# Patient Record
Sex: Female | Born: 1955 | Race: White | Hispanic: No | Marital: Married | Smoking: Never smoker
Health system: Southern US, Community
[De-identification: ages and names within clinical notes are randomized; demographics above are authoritative.]

## PROBLEM LIST (undated history)

## (undated) DIAGNOSIS — B019 Varicella without complication: Secondary | ICD-10-CM

## (undated) DIAGNOSIS — K219 Gastro-esophageal reflux disease without esophagitis: Secondary | ICD-10-CM

## (undated) DIAGNOSIS — F329 Major depressive disorder, single episode, unspecified: Secondary | ICD-10-CM

## (undated) DIAGNOSIS — J189 Pneumonia, unspecified organism: Secondary | ICD-10-CM

## (undated) DIAGNOSIS — Z9889 Other specified postprocedural states: Secondary | ICD-10-CM

## (undated) DIAGNOSIS — N390 Urinary tract infection, site not specified: Secondary | ICD-10-CM

## (undated) DIAGNOSIS — R7303 Prediabetes: Secondary | ICD-10-CM

## (undated) DIAGNOSIS — J45909 Unspecified asthma, uncomplicated: Secondary | ICD-10-CM

## (undated) DIAGNOSIS — E079 Disorder of thyroid, unspecified: Secondary | ICD-10-CM

## (undated) DIAGNOSIS — J309 Allergic rhinitis, unspecified: Secondary | ICD-10-CM

## (undated) DIAGNOSIS — F419 Anxiety disorder, unspecified: Secondary | ICD-10-CM

## (undated) DIAGNOSIS — E039 Hypothyroidism, unspecified: Secondary | ICD-10-CM

## (undated) DIAGNOSIS — N393 Stress incontinence (female) (male): Secondary | ICD-10-CM

## (undated) DIAGNOSIS — M1990 Unspecified osteoarthritis, unspecified site: Secondary | ICD-10-CM

## (undated) DIAGNOSIS — F32A Depression, unspecified: Secondary | ICD-10-CM

## (undated) HISTORY — PX: TONSILLECTOMY: SUR1361

## (undated) HISTORY — PX: CHOLECYSTECTOMY: SHX55

## (undated) HISTORY — DX: Gastro-esophageal reflux disease without esophagitis: K21.9

## (undated) HISTORY — PX: REFRACTIVE SURGERY: SHX103

## (undated) HISTORY — DX: Stress incontinence (female) (male): N39.3

## (undated) HISTORY — DX: Varicella without complication: B01.9

## (undated) HISTORY — DX: Major depressive disorder, single episode, unspecified: F32.9

## (undated) HISTORY — DX: Allergic rhinitis, unspecified: J30.9

## (undated) HISTORY — PX: KNEE ARTHROSCOPY: SHX127

## (undated) HISTORY — DX: Depression, unspecified: F32.A

## (undated) HISTORY — DX: Urinary tract infection, site not specified: N39.0

## (undated) HISTORY — DX: Disorder of thyroid, unspecified: E07.9

---

## 2004-05-13 ENCOUNTER — Ambulatory Visit: Payer: Self-pay

## 2005-06-18 ENCOUNTER — Ambulatory Visit: Payer: Self-pay

## 2006-06-22 ENCOUNTER — Ambulatory Visit: Payer: Self-pay

## 2007-07-14 ENCOUNTER — Ambulatory Visit: Payer: Self-pay

## 2008-07-20 ENCOUNTER — Ambulatory Visit: Payer: Self-pay

## 2009-07-26 ENCOUNTER — Ambulatory Visit: Payer: Self-pay

## 2009-10-09 ENCOUNTER — Ambulatory Visit: Payer: Self-pay

## 2009-10-10 ENCOUNTER — Ambulatory Visit: Payer: Self-pay

## 2010-02-19 ENCOUNTER — Ambulatory Visit: Payer: Self-pay | Admitting: Specialist

## 2010-08-08 ENCOUNTER — Ambulatory Visit: Payer: Self-pay

## 2011-12-08 ENCOUNTER — Other Ambulatory Visit: Payer: Self-pay

## 2011-12-14 ENCOUNTER — Encounter: Payer: Self-pay | Admitting: Family Medicine

## 2013-06-24 ENCOUNTER — Ambulatory Visit: Payer: Self-pay | Admitting: Family Medicine

## 2013-06-29 ENCOUNTER — Ambulatory Visit: Payer: Self-pay | Admitting: Family Medicine

## 2013-08-07 ENCOUNTER — Ambulatory Visit: Payer: Self-pay | Admitting: Family Medicine

## 2014-10-17 ENCOUNTER — Other Ambulatory Visit: Payer: Self-pay | Admitting: Family Medicine

## 2014-10-17 DIAGNOSIS — K219 Gastro-esophageal reflux disease without esophagitis: Secondary | ICD-10-CM

## 2014-10-17 NOTE — Telephone Encounter (Signed)
This is a pt of Dr. Santiago Bur  Last ov was on 03/15/2014  thanks

## 2014-11-08 ENCOUNTER — Other Ambulatory Visit: Payer: Self-pay | Admitting: Unknown Physician Specialty

## 2014-11-08 DIAGNOSIS — N6459 Other signs and symptoms in breast: Secondary | ICD-10-CM

## 2014-11-16 ENCOUNTER — Ambulatory Visit
Admission: RE | Admit: 2014-11-16 | Discharge: 2014-11-16 | Disposition: A | Discharge status: Home / Self Care | Payer: BC Managed Care – PPO | Source: Ambulatory Visit | Attending: Unknown Physician Specialty | Admitting: Unknown Physician Specialty

## 2014-11-16 DIAGNOSIS — N6459 Other signs and symptoms in breast: Secondary | ICD-10-CM

## 2014-11-16 DIAGNOSIS — R921 Mammographic calcification found on diagnostic imaging of breast: Secondary | ICD-10-CM | POA: Diagnosis not present

## 2014-11-22 ENCOUNTER — Other Ambulatory Visit: Payer: Self-pay | Admitting: Unknown Physician Specialty

## 2014-11-22 DIAGNOSIS — R921 Mammographic calcification found on diagnostic imaging of breast: Secondary | ICD-10-CM

## 2014-11-28 ENCOUNTER — Ambulatory Visit
Admission: RE | Admit: 2014-11-28 | Discharge: 2014-11-28 | Disposition: A | Discharge status: Home / Self Care | Payer: BC Managed Care – PPO | Source: Ambulatory Visit | Attending: Unknown Physician Specialty | Admitting: Unknown Physician Specialty

## 2014-11-28 DIAGNOSIS — R921 Mammographic calcification found on diagnostic imaging of breast: Secondary | ICD-10-CM | POA: Diagnosis present

## 2014-11-28 HISTORY — PX: BREAST BIOPSY: SHX20

## 2014-11-29 LAB — SURGICAL PATHOLOGY

## 2015-02-12 HISTORY — PX: HAND SURGERY: SHX662

## 2015-03-18 ENCOUNTER — Encounter: Payer: Self-pay | Admitting: Family Medicine

## 2015-03-18 ENCOUNTER — Ambulatory Visit (INDEPENDENT_AMBULATORY_CARE_PROVIDER_SITE_OTHER): Payer: BC Managed Care – PPO | Admitting: Family Medicine

## 2015-03-18 VITALS — BP 122/78 | HR 68 | Temp 98.8°F | Resp 16 | Ht 65.5 in | Wt 238.0 lb

## 2015-03-18 DIAGNOSIS — K219 Gastro-esophageal reflux disease without esophagitis: Secondary | ICD-10-CM

## 2015-03-18 DIAGNOSIS — J45909 Unspecified asthma, uncomplicated: Secondary | ICD-10-CM | POA: Insufficient documentation

## 2015-03-18 DIAGNOSIS — F419 Anxiety disorder, unspecified: Secondary | ICD-10-CM | POA: Insufficient documentation

## 2015-03-18 DIAGNOSIS — J069 Acute upper respiratory infection, unspecified: Secondary | ICD-10-CM | POA: Diagnosis not present

## 2015-03-18 DIAGNOSIS — K2100 Gastro-esophageal reflux disease with esophagitis, without bleeding: Secondary | ICD-10-CM | POA: Insufficient documentation

## 2015-03-18 DIAGNOSIS — F329 Major depressive disorder, single episode, unspecified: Secondary | ICD-10-CM | POA: Insufficient documentation

## 2015-03-18 MED ORDER — OMEPRAZOLE 20 MG PO CPDR: 20.0000 mg | DELAYED_RELEASE_CAPSULE | Freq: Two times a day (BID) | ORAL | Status: DC

## 2015-03-18 MED ORDER — GUAIFENESIN-CODEINE 100-10 MG/5ML PO SOLN: 5.0000 mL | ORAL | Status: DC | PRN

## 2015-03-18 NOTE — Progress Notes (Signed)
Patient ID: Diane Walsh, female   DOB: 02/06/1956, 60 y.o.   MRN: [id number]        Patient: Diane Walsh Female    DOB: 09-04-1955   60 y.o.   MRN: [id number] Visit Date: 03/18/2015  Today's Provider: Lorie Phenix, MD   Chief Complaint  Patient presents with  . URI   Subjective:    URI  This is a new problem. The current episode started in the past 7 days. The problem has been gradually improving. The fever has been present for 1 to 2 days (Low grade fevers; 99.8.  Pt says that has resolved. ). Associated symptoms include congestion, coughing, headaches, neck pain, rhinorrhea, sinus pain, sneezing, a sore throat and swollen glands. Pertinent negatives include no abdominal pain, diarrhea, ear pain, plugged ear sensation or wheezing. She has tried decongestant for the symptoms. The treatment provided mild relief.   Also need Pantoprazole refilled.   May need a prior authorization. Symptoms stable. Has not tried to taper recently. Does not think Omeprazole worked in the past.     No Known Allergies Previous Medications   FLUTICASONE (FLONASE) 50 MCG/ACT NASAL SPRAY    Place into the nose.   LEVOTHYROXINE (SYNTHROID, LEVOTHROID) 75 MCG TABLET    Take by mouth.   METHOCARBAMOL (ROBAXIN) 500 MG TABLET    TAKE 1 TABLET BY MOUTH EVERY 6-8 HOURS AS NEEDED FOR SPASMS   MONTELUKAST (SINGULAIR) 10 MG TABLET    Take 10 mg by mouth daily.   NEXIUM 40 MG CAPSULE    TAKE ONE CAPSULE BY MOUTH EVERY DAY   OXYCODONE (OXY IR/ROXICODONE) 5 MG IMMEDIATE RELEASE TABLET    Take by mouth. for pain   SERTRALINE (ZOLOFT) 100 MG TABLET    Take by mouth.   SERTRALINE (ZOLOFT) 100 MG TABLET    Take 100 mg by mouth every evening.   XERESE 5-1 % CREA    APPLY TO AFFECTED AREA 5 TIMES A DAY    Review of Systems  Constitutional: Positive for activity change and fatigue. Negative for fever, chills, diaphoresis, appetite change and unexpected weight change.  HENT: Positive for congestion, postnasal drip,  rhinorrhea, sinus pressure, sneezing, sore throat, tinnitus, trouble swallowing and voice change. Negative for ear discharge, ear pain and nosebleeds.   Eyes: Positive for discharge, redness and itching. Negative for photophobia, pain and visual disturbance.  Respiratory: Positive for cough and shortness of breath. Negative for apnea, choking, chest tightness, wheezing and stridor.   Cardiovascular: Negative.   Gastrointestinal: Negative.  Negative for abdominal pain and diarrhea.  Musculoskeletal: Positive for neck pain.  Neurological: Positive for light-headedness and headaches. Negative for dizziness.    Social History  Substance Use Topics  . Smoking status: Never Smoker   . Smokeless tobacco: Never Used  . Alcohol Use: Yes     Comment: Very Rarely   Objective:   BP 122/78 mmHg  Pulse 68  Temp(Src) 98.8 F (37.1 C) (Oral)  Resp 16  Ht 5' 5.5" (1.664 m)  Wt 238 lb (107.956 kg)  BMI 38.99 kg/m2  Physical Exam  Constitutional: She is oriented to person, place, and time. She appears well-developed and well-nourished.  HENT:  Head: Normocephalic and atraumatic.  Right Ear: External ear normal.  Left Ear: External ear normal.  Nose: Nose normal.  Mouth/Throat: Oropharynx is clear and moist.  Cardiovascular: Normal rate, regular rhythm and normal heart sounds.   Pulmonary/Chest: Effort normal and breath sounds normal.  Neurological: She is  alert and oriented to person, place, and time.      Assessment & Plan:     1. Upper respiratory infection New problem. Slowly improving. Does have history of pneumonia. Patient instructed to call back if condition worsens or does not improve.    - guaiFENesin-codeine 100-10 MG/5ML syrup; Take 5 mLs by mouth every 4 (four) hours as needed for cough.  Dispense: 120 mL; Refill: 0  2. Gastroesophageal reflux disease without esophagitis Will retry Omeprazole.  - omeprazole (PRILOSEC) 20 MG capsule; Take 1 capsule (20 mg total) by mouth 2  (two) times daily before a meal.  Dispense: 180 capsule; Refill: 1     Patient was seen and examined by Leo GrosserNancy J. Courvoisier Hamblen, MD, and note scribed by Kavin LeechLaura Walsh, CMA.  I have reviewed the document for accuracy and completeness and I agree with above. - Leo GrosserNancy J. Heran Campau, MD  Lorie PhenixNancy Martin Smeal, MD  [facility]

## 2015-05-27 ENCOUNTER — Other Ambulatory Visit: Payer: Self-pay | Admitting: Family Medicine

## 2015-05-27 DIAGNOSIS — J309 Allergic rhinitis, unspecified: Secondary | ICD-10-CM

## 2015-05-27 DIAGNOSIS — F419 Anxiety disorder, unspecified: Secondary | ICD-10-CM

## 2015-05-27 DIAGNOSIS — E039 Hypothyroidism, unspecified: Secondary | ICD-10-CM

## 2015-06-03 ENCOUNTER — Other Ambulatory Visit: Payer: Self-pay | Admitting: Family Medicine

## 2015-06-03 DIAGNOSIS — J4520 Mild intermittent asthma, uncomplicated: Secondary | ICD-10-CM

## 2015-06-10 ENCOUNTER — Other Ambulatory Visit: Payer: Self-pay | Admitting: Family Medicine

## 2015-06-10 DIAGNOSIS — F419 Anxiety disorder, unspecified: Secondary | ICD-10-CM

## 2015-06-11 NOTE — Telephone Encounter (Signed)
LOV for chronic problems/ CPE was 10/06/2013. Allene DillonEmily Drozdowski, CMA

## 2015-07-13 ENCOUNTER — Encounter: Payer: Self-pay | Admitting: Family Medicine

## 2015-07-13 ENCOUNTER — Ambulatory Visit (INDEPENDENT_AMBULATORY_CARE_PROVIDER_SITE_OTHER): Payer: BC Managed Care – PPO | Admitting: Family Medicine

## 2015-07-13 VITALS — BP 130/80 | HR 72 | Temp 98.6°F | Resp 16 | Ht 65.5 in | Wt 237.0 lb

## 2015-07-13 DIAGNOSIS — J0100 Acute maxillary sinusitis, unspecified: Secondary | ICD-10-CM

## 2015-07-13 DIAGNOSIS — H1030 Unspecified acute conjunctivitis, unspecified eye: Secondary | ICD-10-CM | POA: Diagnosis not present

## 2015-07-13 DIAGNOSIS — J019 Acute sinusitis, unspecified: Secondary | ICD-10-CM | POA: Insufficient documentation

## 2015-07-13 MED ORDER — AMOXICILLIN-POT CLAVULANATE 875-125 MG PO TABS: 1.0000 | ORAL_TABLET | Freq: Two times a day (BID) | ORAL | Status: DC

## 2015-07-13 MED ORDER — SULFACETAMIDE SODIUM 10 % OP SOLN: 2.0000 [drp] | OPHTHALMIC | Status: DC

## 2015-07-13 NOTE — Progress Notes (Signed)
Patient: Diane Walsh Female    DOB: 08-Mar-1955   60 y.o.   MRN: [id number] Visit Date: 07/13/2015  Today's Provider: Lorie PhenixNancy Braiden Rodman, MD   Chief Complaint  Patient presents with  . Conjunctivitis   Subjective:    Conjunctivitis  The current episode started 2 days ago. The problem occurs continuously. The problem has been unchanged. The problem is moderate. The symptoms are relieved by one or more OTC medications (Visine. ). Nothing aggravates the symptoms. Associated symptoms include decreased vision, eye itching, photophobia, congestion, headaches, rhinorrhea, sore throat, eye discharge and eye redness. Pertinent negatives include no fever, no double vision, no abdominal pain, no diarrhea, no nausea, no vomiting, no ear discharge, no ear pain, no hearing loss, no mouth sores, no stridor, no swollen glands, no rash and no eye pain.   Has had something like this in the past.  Did see an optometrist in the past.   Does have history of pink eye.    Eyes became irritated Thursday  07/11/2015. Eyes started out red and itchy. Now left eye is worse with redness, itching, white drainage and some blurry vision. Has been having headaches, sore throat and runny nose. No fever. No fever. Does have headache more on the right side.    Left eye more irritated. Matted shut in am.      No Known Allergies Previous Medications   FLUTICASONE (FLONASE) 50 MCG/ACT NASAL SPRAY    Place into the nose.   LEVOTHYROXINE (SYNTHROID, LEVOTHROID) 75 MCG TABLET    TAKE 1 TABLET BY MOUTH EVERY DAY   MONTELUKAST (SINGULAIR) 10 MG TABLET    TAKE 1 TABLET BY MOUTH EVERY DAY   NEXIUM 40 MG CAPSULE    TAKE ONE CAPSULE BY MOUTH EVERY DAY   SERTRALINE (ZOLOFT) 100 MG TABLET    TAKE 1 TABLET BY MOUTH EVERY EVENING   XERESE 5-1 % CREA    APPLY TO AFFECTED AREA 5 TIMES A DAY    Review of Systems  Constitutional: Positive for appetite change. Negative for fever, chills and fatigue.  HENT: Positive for congestion,  rhinorrhea and sore throat. Negative for ear discharge, ear pain, hearing loss and mouth sores.   Eyes: Positive for photophobia, discharge, redness, itching and visual disturbance. Negative for double vision and pain.  Respiratory: Negative for chest tightness, shortness of breath and stridor.   Cardiovascular: Negative for chest pain and palpitations.  Gastrointestinal: Negative for nausea, vomiting, abdominal pain and diarrhea.  Skin: Negative for rash.  Neurological: Positive for headaches. Negative for dizziness and weakness.    Social History  Substance Use Topics  . Smoking status: Never Smoker   . Smokeless tobacco: Never Used  . Alcohol Use: Yes     Comment: Very Rarely   Objective:   BP 130/80 mmHg  Pulse 72  Temp(Src) 98.6 F (37 C) (Oral)  Resp 16  Ht 5' 5.5" (1.664 m)  Wt 237 lb (107.502 kg)  BMI 38.82 kg/m2  SpO2 95%  Physical Exam  Constitutional: She is oriented to person, place, and time. She appears well-developed and well-nourished.  HENT:  Head: Normocephalic and atraumatic.  Right Ear: Tympanic membrane and external ear normal.  Left Ear: Tympanic membrane and external ear normal.  Nose: Mucosal edema and rhinorrhea present. Right sinus exhibits maxillary sinus tenderness. Left sinus exhibits maxillary sinus tenderness.  Mouth/Throat: Uvula is midline and oropharynx is clear and moist.  Eyes: EOM and lids are normal. Pupils are  equal, round, and reactive to light. Right conjunctiva is injected. Left conjunctiva is injected. Right eye exhibits normal extraocular motion. Left eye exhibits normal extraocular motion.  Neck: Normal range of motion. Neck supple.  Cardiovascular: Normal rate and regular rhythm.   Pulmonary/Chest: Effort normal and breath sounds normal. She has no wheezes. She has no rales.  Neurological: She is alert and oriented to person, place, and time.      Assessment & Plan:     1. Acute maxillary sinusitis, recurrence not  specified New problem. Worsening.  Start medication.  - amoxicillin-clavulanate (AUGMENTIN) 875-125 MG tablet; Take 1 tablet by mouth 2 (two) times daily.  Dispense: 20 tablet; Refill: 0  2. Conjunctivitis, acute New problem. Condition is worsening. Will start medication for better control.  Call if worsens or does not improve, especially eye pain or vision changes.    - sulfacetamide (BLEPH-10) 10 % ophthalmic solution; Place 2 drops into both eyes every 3 (three) hours while awake.  Dispense: 15 mL; Refill: 0  Follow up for CPE.      Lorie Phenix, MD  [facility]

## 2015-08-27 ENCOUNTER — Ambulatory Visit (INDEPENDENT_AMBULATORY_CARE_PROVIDER_SITE_OTHER): Payer: BC Managed Care – PPO | Admitting: Family Medicine

## 2015-08-27 ENCOUNTER — Encounter: Payer: Self-pay | Admitting: Family Medicine

## 2015-08-27 VITALS — BP 124/82 | HR 80 | Temp 97.9°F | Resp 16 | Ht 65.5 in | Wt 240.0 lb

## 2015-08-27 DIAGNOSIS — Z0000 Encounter for general adult medical examination without abnormal findings: Secondary | ICD-10-CM | POA: Diagnosis not present

## 2015-08-27 DIAGNOSIS — E559 Vitamin D deficiency, unspecified: Secondary | ICD-10-CM | POA: Diagnosis not present

## 2015-08-27 DIAGNOSIS — F419 Anxiety disorder, unspecified: Secondary | ICD-10-CM

## 2015-08-27 DIAGNOSIS — E039 Hypothyroidism, unspecified: Secondary | ICD-10-CM | POA: Diagnosis not present

## 2015-08-27 DIAGNOSIS — J309 Allergic rhinitis, unspecified: Secondary | ICD-10-CM

## 2015-08-27 LAB — POCT URINALYSIS DIPSTICK
Bilirubin, UA: NEGATIVE
Blood, UA: NEGATIVE
Glucose, UA: NEGATIVE
Ketones, UA: NEGATIVE
Leukocytes, UA: NEGATIVE
Nitrite, UA: NEGATIVE
Protein, UA: NEGATIVE
Spec Grav, UA: 1.01
Urobilinogen, UA: 0.2
pH, UA: 6

## 2015-08-27 NOTE — Progress Notes (Signed)
Patient: Diane MullKathy M Griesinger, Female    DOB: February 17, 1956, 60 y.o.   MRN: [id number] Visit Date: 08/27/2015  Today's Provider: Lorie PhenixNancy Pearline Yerby, MD   Chief Complaint  Patient presents with  . Annual Exam   Subjective:    Annual physical exam Diane Walsh is a 60 y.o. female who presents today for health maintenance and complete physical. She feels well. She reports not exercising. She reports she is sleeping fairly well.  Pt reports having a hard time falling asleep.   -----------------------------------------------------------------   Review of Systems  Constitutional: Negative.   HENT: Negative.   Eyes: Negative.   Respiratory: Negative.   Cardiovascular: Negative.   Gastrointestinal: Negative.   Endocrine: Negative.   Genitourinary: Negative.   Musculoskeletal: Negative.   Skin: Negative.   Allergic/Immunologic: Negative.   Neurological: Negative.   Hematological: Negative.   Psychiatric/Behavioral: Negative.     Social History      She  reports that she has never smoked. She has never used smokeless tobacco. She reports that she drinks alcohol. She reports that she does not use illicit drugs.       Social History   Social History  . Marital Status: Married    Spouse Name: N/A  . Number of Children: 2  . Years of Education: College   Occupational History  . Full Time    Social History Main Topics  . Smoking status: Never Smoker   . Smokeless tobacco: Never Used  . Alcohol Use: Yes     Comment: Very Rarely  . Drug Use: No  . Sexual Activity: Not Asked   Other Topics Concern  . None   Social History Narrative    No past medical history on file.   Patient Active Problem List   Diagnosis Date Noted  . Annual physical exam 08/27/2015  . Sinusitis, acute 07/13/2015  . Conjunctivitis, acute 07/13/2015  . Anxiety 03/18/2015  . RAD (reactive airway disease) 03/18/2015  . Esophagitis, reflux 03/18/2015  . Avitaminosis D 07/13/2008  . Allergic  rhinitis 08/23/2005  . Acid reflux 08/23/2005  . Adult hypothyroidism 08/23/2005  . Acne erythematosa 08/23/2005    Past Surgical History  Procedure Laterality Date  . Hand surgery Right 02/12/2015    [facility]    Family History        Family Status  Relation Status Death Age  . Mother Alive   . Father Deceased [id number]  . Sister Alive         Her family history includes Breast cancer in her cousin and cousin; Dementia in her mother; Diabetes in her father; Hypertension in her father and sister; Hypothyroidism in her mother.    No Known Allergies  Current Meds  Medication Sig  . fluticasone (FLONASE) 50 MCG/ACT nasal spray Place into the nose.  . levothyroxine (SYNTHROID, LEVOTHROID) 75 MCG tablet TAKE 1 TABLET BY MOUTH EVERY DAY  . montelukast (SINGULAIR) 10 MG tablet TAKE 1 TABLET BY MOUTH EVERY DAY  . omeprazole (PRILOSEC) 20 MG capsule   . sertraline (ZOLOFT) 100 MG tablet TAKE 1 TABLET BY MOUTH EVERY EVENING  . [DISCONTINUED] XERESE 5-1 % CREA APPLY TO AFFECTED AREA 5 TIMES A DAY    Patient Care Team: Lorie PhenixNancy Steffany Schoenfelder, MD as PCP - General (Family Medicine)     Objective:   Vitals: BP 124/82 mmHg  Pulse 80  Temp(Src) 97.9 F (36.6 C) (Oral)  Resp 16  Ht 5' 5.5" (1.664 m)  Wt 240 lb (108.863 kg)  BMI 39.32 kg/m2   Physical Exam  Constitutional: She is oriented to person, place, and time. She appears well-developed and well-nourished.  HENT:  Head: Normocephalic and atraumatic.  Right Ear: External ear normal.  Left Ear: External ear normal.  Nose: Nose normal.  Mouth/Throat: Oropharynx is clear and moist.  Eyes: Conjunctivae and EOM are normal. Pupils are equal, round, and reactive to light.  Neck: Normal range of motion. Neck supple.  Cardiovascular: Normal rate and normal heart sounds.   Pulmonary/Chest: Effort normal and breath sounds normal.  Neurological: She is alert and oriented to person, place, and time.  Skin: Skin is warm and  dry.  Psychiatric: She has a normal mood and affect. Her behavior is normal. Judgment and thought content normal.     Depression Screen No flowsheet data found.    Assessment & Plan:     Routine Health Maintenance and Physical Exam  Exercise Activities and Dietary recommendations Goals    None       There is no immunization history on file for this patient.  Health Maintenance  Topic Date Due  . TETANUS/TDAP  02/19/1973  . PAP SMEAR  02/20/1975  . ZOSTAVAX  02/19/2014  . INFLUENZA VACCINE  10/01/2015  . MAMMOGRAM  11/27/2016  . COLONOSCOPY  10/26/2019  . Hepatitis C Screening  Completed  . HIV Screening  Completed      Discussed health benefits of physical activity, and encouraged her to engage in regular exercise appropriate for her age and condition.      -------------------------------------------------------------------- 2. Hypothyroidism, unspecified hypothyroidism type Will check labs.   - TSH  3. Avitaminosis D Stable; will check labs.  - CBC with Differential/Platelet - Comprehensive metabolic panel - VITAMIN D 25 Hydroxy (Vit-D Deficiency, Fractures)  4. Anxiety Stable; discussed trying OTC Melatonin 1mg  to help her go to sleep at night.   Patient was seen and examined by Leo GrosserNancy J. Sufian Ravi, MD, and note scribed by Kavin LeechLaura Walsh, CMA.   I have reviewed the document for accuracy and completeness and I agree with above. - Leo GrosserNancy J. Shanyn Preisler, MD   Lorie PhenixNancy Brinkley Peet, MD  [facility]

## 2015-08-29 LAB — CBC WITH DIFFERENTIAL/PLATELET
BASOS ABS: 0.1 10*3/uL (ref 0.0–0.2)
Basos: 1 %
EOS (ABSOLUTE): 0.3 10*3/uL (ref 0.0–0.4)
Eos: 4 %
HEMOGLOBIN: 11.8 g/dL (ref 11.1–15.9)
Hematocrit: 37.1 % (ref 34.0–46.6)
IMMATURE GRANS (ABS): 0 10*3/uL (ref 0.0–0.1)
Immature Granulocytes: 0 %
LYMPHS: 24 %
Lymphocytes Absolute: 1.9 10*3/uL (ref 0.7–3.1)
MCH: 28 pg (ref 26.6–33.0)
MCHC: 31.8 g/dL (ref 31.5–35.7)
MCV: 88 fL (ref 79–97)
MONOCYTES: 6 %
Monocytes Absolute: 0.5 10*3/uL (ref 0.1–0.9)
Neutrophils Absolute: 5.2 10*3/uL (ref 1.4–7.0)
Neutrophils: 65 %
PLATELETS: 320 10*3/uL (ref 150–379)
RBC: 4.22 x10E6/uL (ref 3.77–5.28)
RDW: 15.6 % — AB (ref 12.3–15.4)
WBC: 8 10*3/uL (ref 3.4–10.8)

## 2015-08-29 LAB — COMPREHENSIVE METABOLIC PANEL
A/G RATIO: 1.4 (ref 1.2–2.2)
ALK PHOS: 113 IU/L (ref 39–117)
ALT: 14 IU/L (ref 0–32)
AST: 19 IU/L (ref 0–40)
Albumin: 4.1 g/dL (ref 3.6–4.8)
BUN/Creatinine Ratio: 17 (ref 12–28)
BUN: 13 mg/dL (ref 8–27)
Bilirubin Total: 0.5 mg/dL (ref 0.0–1.2)
CO2: 23 mmol/L (ref 18–29)
CREATININE: 0.75 mg/dL (ref 0.57–1.00)
Calcium: 9.5 mg/dL (ref 8.7–10.3)
Chloride: 102 mmol/L (ref 96–106)
GFR calc Af Amer: 99 mL/min/{1.73_m2} (ref >59)
GFR calc non Af Amer: 86 mL/min/{1.73_m2} (ref >59)
GLOBULIN, TOTAL: 2.9 g/dL (ref 1.5–4.5)
Glucose: 103 mg/dL — ABNORMAL HIGH (ref 65–99)
POTASSIUM: 4.7 mmol/L (ref 3.5–5.2)
SODIUM: 141 mmol/L (ref 134–144)
Total Protein: 7 g/dL (ref 6.0–8.5)

## 2015-08-29 LAB — LIPID PANEL
CHOLESTEROL TOTAL: 218 mg/dL — AB (ref 100–199)
Chol/HDL Ratio: 4.2 ratio units (ref 0.0–4.4)
HDL: 52 mg/dL (ref >39)
LDL Calculated: 138 mg/dL — ABNORMAL HIGH (ref 0–99)
TRIGLYCERIDES: 138 mg/dL (ref 0–149)
VLDL Cholesterol Cal: 28 mg/dL (ref 5–40)

## 2015-08-29 LAB — TSH: TSH: 4.41 u[IU]/mL (ref 0.450–4.500)

## 2015-08-29 LAB — VITAMIN D 25 HYDROXY (VIT D DEFICIENCY, FRACTURES): Vit D, 25-Hydroxy: 25 ng/mL — ABNORMAL LOW (ref 30.0–100.0)

## 2015-10-03 ENCOUNTER — Other Ambulatory Visit: Payer: Self-pay | Admitting: Family Medicine

## 2015-10-03 DIAGNOSIS — K219 Gastro-esophageal reflux disease without esophagitis: Secondary | ICD-10-CM

## 2015-11-29 ENCOUNTER — Other Ambulatory Visit: Payer: Self-pay | Admitting: Family Medicine

## 2015-11-29 DIAGNOSIS — E039 Hypothyroidism, unspecified: Secondary | ICD-10-CM

## 2015-11-29 DIAGNOSIS — F419 Anxiety disorder, unspecified: Secondary | ICD-10-CM

## 2015-11-29 DIAGNOSIS — J309 Allergic rhinitis, unspecified: Secondary | ICD-10-CM

## 2015-12-27 ENCOUNTER — Telehealth: Payer: Self-pay | Admitting: Physician Assistant

## 2015-12-27 DIAGNOSIS — Z1239 Encounter for other screening for malignant neoplasm of breast: Secondary | ICD-10-CM

## 2015-12-27 NOTE — Telephone Encounter (Signed)
Pt [facility]. Pt states she tired calling to make her own appointment and they told her that they needed something from her Dr to get her mammogram.  Pt's CB# [phone] Thanks CC

## 2015-12-30 NOTE — Telephone Encounter (Signed)
Mammogram order has been placed. She may call to schedule now

## 2015-12-31 NOTE — Telephone Encounter (Signed)
Left detailed message on vm.

## 2016-01-10 ENCOUNTER — Other Ambulatory Visit: Payer: Self-pay | Admitting: Family Medicine

## 2016-01-10 DIAGNOSIS — K219 Gastro-esophageal reflux disease without esophagitis: Secondary | ICD-10-CM

## 2016-01-10 NOTE — Telephone Encounter (Signed)
Please review-aa 

## 2016-01-17 ENCOUNTER — Encounter: Payer: Self-pay | Admitting: Family Medicine

## 2016-01-17 ENCOUNTER — Ambulatory Visit (INDEPENDENT_AMBULATORY_CARE_PROVIDER_SITE_OTHER): Payer: BC Managed Care – PPO | Admitting: Family Medicine

## 2016-01-17 VITALS — BP 132/70 | HR 90 | Temp 98.1°F | Resp 16 | Wt 245.0 lb

## 2016-01-17 DIAGNOSIS — J069 Acute upper respiratory infection, unspecified: Secondary | ICD-10-CM

## 2016-01-17 DIAGNOSIS — B9789 Other viral agents as the cause of diseases classified elsewhere: Secondary | ICD-10-CM | POA: Diagnosis not present

## 2016-01-17 MED ORDER — BENZONATATE 100 MG PO CAPS: ORAL_CAPSULE | ORAL | 0 refills | Status: DC

## 2016-01-17 NOTE — Progress Notes (Signed)
Subjective:     Patient ID: Cyndie MullKathy M Chojnowski, female   DOB: 10-19-1955, 60 y.o.   MRN: [id number]  HPI  Chief Complaint  Patient presents with  . URI    x 3 days. Main complaint is cough. Productive with green sputum. Also c/o chills, sore throat, neck pain, some headache. Pt has a H/O RAD. Pt has tried Mucinex and Delsym, as well as Aleve, with some relief.  Not on respiratory medication.   Review of Systems     Objective:   Physical Exam  Constitutional: She appears well-developed and well-nourished. No distress.  Ears: T.M's intact without inflammation Throat: tonsils absent, no erythema Neck: bilateral anterior cervical tenderness Lungs: clear     Assessment:    1. Viral upper respiratory tract infection - benzonatate (TESSALON PERLES) 100 MG capsule; One or two every 8 hours as needed for cough  Dispense: 20 capsule; Refill: 0    Plan:    Discussed use of Mucinex D for congestion, Delsym for cough, and Benadryl for postnasal drainage

## 2016-01-17 NOTE — Patient Instructions (Signed)
Discussed use of Mucinex D for congestion, Delsym for cough, and Benadryl for postnasal drainage 

## 2016-01-21 ENCOUNTER — Other Ambulatory Visit: Payer: Self-pay | Admitting: Family Medicine

## 2016-01-21 ENCOUNTER — Telehealth: Payer: Self-pay

## 2016-01-21 DIAGNOSIS — J069 Acute upper respiratory infection, unspecified: Secondary | ICD-10-CM

## 2016-01-21 MED ORDER — HYDROCODONE-HOMATROPINE 5-1.5 MG/5ML PO SYRP: ORAL_SOLUTION | ORAL | 0 refills | Status: DC

## 2016-01-21 NOTE — Telephone Encounter (Signed)
Pt called c/o worsening sx. Was seen  01/17/2016 for viral URI. Was started on [employer] Pet[employer]alon Perles, which pt had to D/C due to dizziness, H/A and nausea. Is taking Mucinex, without relief. Is c/o cough with green sputum, hoarse voice and SOB. Does have RAD. [facility] [address] Please advise. Allene DillonEmily Drozdowski, CMA

## 2016-01-21 NOTE — Telephone Encounter (Signed)
States sinuses are drying up and cough is less productive. Did not tolerate Tessalon Perles. Will put Hycodan up front for pickup.

## 2016-01-24 ENCOUNTER — Ambulatory Visit
Admission: RE | Admit: 2016-01-24 | Discharge: 2016-01-24 | Disposition: A | Discharge status: Home / Self Care | Payer: BC Managed Care – PPO | Source: Ambulatory Visit | Attending: Family Medicine | Admitting: Family Medicine

## 2016-01-24 ENCOUNTER — Encounter: Payer: Self-pay | Admitting: Family Medicine

## 2016-01-24 ENCOUNTER — Telehealth: Payer: Self-pay

## 2016-01-24 ENCOUNTER — Ambulatory Visit (INDEPENDENT_AMBULATORY_CARE_PROVIDER_SITE_OTHER): Payer: BC Managed Care – PPO | Admitting: Family Medicine

## 2016-01-24 VITALS — BP 120/64 | HR 88 | Temp 98.2°F | Resp 20 | Wt 237.0 lb

## 2016-01-24 DIAGNOSIS — J45901 Unspecified asthma with (acute) exacerbation: Secondary | ICD-10-CM

## 2016-01-24 MED ORDER — PREDNISONE 20 MG PO TABS: ORAL_TABLET | ORAL | 0 refills | Status: DC

## 2016-01-24 MED ORDER — CEFDINIR 300 MG PO CAPS: 300.0000 mg | ORAL_CAPSULE | Freq: Two times a day (BID) | ORAL | 0 refills | Status: DC

## 2016-01-24 NOTE — Telephone Encounter (Signed)
Patient was advised. KW 

## 2016-01-24 NOTE — Patient Instructions (Signed)
We will call you with the x-ray results when available.

## 2016-01-24 NOTE — Telephone Encounter (Signed)
-----   Message from Anola Gurneyobert Chauvin, [place] sent at 01/24/2016 11:38 AM EST ----- No pneumonia, X-ray c/w bronchitis

## 2016-01-24 NOTE — Progress Notes (Signed)
Subjective:     Patient ID: Diane Walsh, female   DOB: 03-19-1955, 60 y.o.   MRN: [id number]  HPI  Chief Complaint  Patient presents with  . Cough    Was seen 01/17/2016. Was started on [employer] Pet[employer]alon Perles. Did not tolerate and to D/C the Perles. Is taking Mucinex, Hycodan, and using cough drops. Cough is causing pain in groin and chest. Afebrile. Non-productive cough.  Reports sweats at night and states she has been staying in bed. She is ten days out from onset of symptoms.   Review of Systems     Objective:   Physical Exam  Constitutional: She appears well-developed and well-nourished. She has a sickly appearance.  Pulmonary/Chest: She has wheezes (expiratory in bilateral posterior lung fields.).       Assessment:    1. Asthmatic bronchitis with acute exacerbation, unspecified asthma severity, unspecified whether persistent - cefdinir (OMNICEF) 300 MG capsule; Take 1 capsule (300 mg total) by mouth 2 (two) times daily.  Dispense: 14 capsule; Refill: 0 - predniSONE (DELTASONE) 20 MG tablet; One pill twice daily for 5 days  Dispense: 10 tablet; Refill: 0 - DG Chest 2 View; Future    Plan:    Continue expectorants and cough medication as needed. Further f/u pending x-ray results.

## 2016-01-28 ENCOUNTER — Telehealth: Payer: Self-pay | Admitting: Physician Assistant

## 2016-01-28 DIAGNOSIS — J45901 Unspecified asthma with (acute) exacerbation: Secondary | ICD-10-CM

## 2016-01-28 MED ORDER — PREDNISONE 20 MG PO TABS: ORAL_TABLET | ORAL | 0 refills | Status: DC

## 2016-01-28 MED ORDER — ALBUTEROL SULFATE HFA 108 (90 BASE) MCG/ACT IN AERS: 2.0000 | INHALATION_SPRAY | Freq: Four times a day (QID) | RESPIRATORY_TRACT | 0 refills | Status: DC | PRN

## 2016-01-28 NOTE — Telephone Encounter (Signed)
Recommend she take both prednisone and albuterol inhaler. Have sent rx for both to her pharmacy.

## 2016-01-28 NOTE — Telephone Encounter (Signed)
Patient was advised. KW 

## 2016-01-28 NOTE — Telephone Encounter (Signed)
Patient was seen in office on 01/24/16 and diagnosed with asthmatic bronchitis x-ray performed showed no infiltrate or pulmonary edema but mild perhilar bronchitic changes. Patient states that she will finish last dose of prednisone that was prescribed tonight. Patient complains of chest pain when cough and being short of breath, when I was talking to patient on the phone she seemed out of breath like labored breathing. She states that she does not use inhaler nor was she prescribed one. Patient is requesting that we send in another prescription for prednisone to pharmacy. I notified patient that Nadine CountsBob is out of office this morning and it is uncertain if he will be back this afternoon she requested that I forward this message to a MD in office. Please review over note on 01/24/16 and advise. Thanks [employer]KW

## 2016-01-28 NOTE — Telephone Encounter (Signed)
Pt is calling back to let Nadine CountsBob know how she is doing.  She states she is better but still hurting on the right side of her chest .  She is still coughing.  She says she has one more prednisone for tonight and she will be out.  Will have enough antibiotic until Friday  Please advise.  [phone]  Thanks, Barth Kirksteri

## 2016-01-30 ENCOUNTER — Other Ambulatory Visit: Payer: Self-pay | Admitting: Physician Assistant

## 2016-01-30 DIAGNOSIS — J45901 Unspecified asthma with (acute) exacerbation: Secondary | ICD-10-CM

## 2016-01-30 MED ORDER — CEFDINIR 300 MG PO CAPS: 300.0000 mg | ORAL_CAPSULE | Freq: Two times a day (BID) | ORAL | 0 refills | Status: DC

## 2016-01-30 NOTE — Telephone Encounter (Signed)
Pt contacted office for refill request on the following medications: cefdinir (OMNICEF) 300 MG capsule  Last Written: 01/24/16 Pt stated she is going to finish the medication today and she is feeling some better but thinks she might need another round of medication. [facility] [address] Please advise. Thanks TNP

## 2016-02-14 ENCOUNTER — Encounter (INDEPENDENT_AMBULATORY_CARE_PROVIDER_SITE_OTHER): Payer: Self-pay

## 2016-02-14 ENCOUNTER — Ambulatory Visit
Admission: RE | Admit: 2016-02-14 | Discharge: 2016-02-14 | Disposition: A | Discharge status: Home / Self Care | Payer: BC Managed Care – PPO | Source: Ambulatory Visit | Attending: Physician Assistant | Admitting: Physician Assistant

## 2016-02-14 ENCOUNTER — Encounter: Payer: Self-pay | Admitting: Family Medicine

## 2016-02-14 ENCOUNTER — Telehealth: Payer: Self-pay

## 2016-02-14 ENCOUNTER — Ambulatory Visit (INDEPENDENT_AMBULATORY_CARE_PROVIDER_SITE_OTHER): Payer: BC Managed Care – PPO | Admitting: Family Medicine

## 2016-02-14 VITALS — BP 112/60 | HR 100 | Temp 98.1°F | Resp 18 | Wt 233.2 lb

## 2016-02-14 DIAGNOSIS — J45901 Unspecified asthma with (acute) exacerbation: Secondary | ICD-10-CM | POA: Diagnosis not present

## 2016-02-14 DIAGNOSIS — Z1231 Encounter for screening mammogram for malignant neoplasm of breast: Secondary | ICD-10-CM | POA: Insufficient documentation

## 2016-02-14 DIAGNOSIS — Z1239 Encounter for other screening for malignant neoplasm of breast: Secondary | ICD-10-CM

## 2016-02-14 MED ORDER — ALBUTEROL SULFATE HFA 108 (90 BASE) MCG/ACT IN AERS: 2.0000 | INHALATION_SPRAY | RESPIRATORY_TRACT | 1 refills | Status: DC | PRN

## 2016-02-14 MED ORDER — PREDNISONE 20 MG PO TABS: ORAL_TABLET | ORAL | 0 refills | Status: DC

## 2016-02-14 MED ORDER — LEVOFLOXACIN 500 MG PO TABS: 500.0000 mg | ORAL_TABLET | Freq: Every day | ORAL | 0 refills | Status: DC

## 2016-02-14 NOTE — Telephone Encounter (Signed)
-----   Message from Margaretann LovelessJennifer M Burnette, [place]-C sent at 02/14/2016  1:29 PM EST ----- Normal mammogram. Repeat screening in one year.

## 2016-02-14 NOTE — Telephone Encounter (Signed)
[id number] 

## 2016-02-14 NOTE — Progress Notes (Signed)
Subjective:     Patient ID: Diane Walsh, female   DOB: 1956/02/23, 60 y.o.   MRN: [id number]  HPI  Chief Complaint  Patient presents with  . Bronchitis    Patient returns to office for follow up bronchitis, last office visit 01/24/16 x-ray ordered consistent with bronchitis patient was prescribed Prednisone and Omnicef. On 01/28/16 paitent was prescribed albuterol inhaler, patient reports that she finished antibiotics and cough has not resolved. Patient reports: shortness of breath, wheezing, productive cough, chest pain and groin pain.   Marland Kitchen. Hemorrhoids    Patient reports external and internal hemorrhoids that she feels appeared due to exertion from coughing. Patient reports using otc hemorrhoid suppositories and wipes.   Reports transient improvement in her sx while on prednisone and cefdinir. However cough/wheezing returned along with greenish sinus drainage on presentation today. Wishes referral to pulmonary as she has had prior pneumonia. Has been using appropriate measures to treat hemorrhoids including sitz baths.   Review of Systems     Objective:   Physical Exam  Constitutional: She appears well-developed and well-nourished. No distress.  Ears: T.M's intact without inflammation Sinuses: non-tender Throat: tonsils absen Neck: no cervical adenopathy Lungs: basilar end expiratory wheezes     Assessment:    1. Asthmatic bronchitis with acute exacerbation, unspecified asthma severity, unspecified whether persistent - levofloxacin (LEVAQUIN) 500 MG tablet; Take 1 tablet (500 mg total) by mouth daily.  Dispense: 7 tablet; Refill: 0 - predniSONE (DELTASONE) 20 MG tablet; Taper as follows: 3 pills for 4 days, two pills for 4 days, one pill for four days  Dispense: 24 tablet; Refill: 0 - Ambulatory referral to Pulmonology - albuterol (PROVENTIL HFA;VENTOLIN HFA) 108 (90 Base) MCG/ACT inhaler; Inhale 2 puffs into the lungs every 4 (four) hours as needed for wheezing or shortness of  breath.  Dispense: 1 Inhaler; Refill: 1    Plan:   Discussed going to ER if breathing worsened despite treatment above.

## 2016-02-14 NOTE — Telephone Encounter (Signed)
Advised patient of results.  

## 2016-02-14 NOTE — Patient Instructions (Signed)
We will call you with the referral time. Continue albuterol every 4 hours as needed.

## 2016-02-18 ENCOUNTER — Ambulatory Visit
Admission: RE | Admit: 2016-02-18 | Discharge: 2016-02-18 | Disposition: A | Discharge status: Home / Self Care | Payer: BC Managed Care – PPO | Source: Ambulatory Visit | Attending: Pulmonary Disease | Admitting: Pulmonary Disease

## 2016-02-18 ENCOUNTER — Ambulatory Visit (INDEPENDENT_AMBULATORY_CARE_PROVIDER_SITE_OTHER): Payer: BC Managed Care – PPO | Admitting: Pulmonary Disease

## 2016-02-18 ENCOUNTER — Encounter: Payer: Self-pay | Admitting: Pulmonary Disease

## 2016-02-18 VITALS — BP 126/82 | HR 78 | Temp 97.8°F | Ht 65.0 in | Wt 228.8 lb

## 2016-02-18 DIAGNOSIS — R05 Cough: Secondary | ICD-10-CM

## 2016-02-18 DIAGNOSIS — R059 Cough, unspecified: Secondary | ICD-10-CM

## 2016-02-18 DIAGNOSIS — R0609 Other forms of dyspnea: Secondary | ICD-10-CM | POA: Diagnosis not present

## 2016-02-18 DIAGNOSIS — J45909 Unspecified asthma, uncomplicated: Secondary | ICD-10-CM | POA: Diagnosis not present

## 2016-02-18 DIAGNOSIS — R918 Other nonspecific abnormal finding of lung field: Secondary | ICD-10-CM

## 2016-02-18 DIAGNOSIS — K219 Gastro-esophageal reflux disease without esophagitis: Secondary | ICD-10-CM

## 2016-02-18 MED ORDER — FLUTICASONE FUROATE-VILANTEROL 200-25 MCG/INH IN AEPB: 1.0000 | INHALATION_SPRAY | Freq: Every day | RESPIRATORY_TRACT | 0 refills | Status: DC

## 2016-02-18 MED ORDER — FLUTICASONE FUROATE-VILANTEROL 100-25 MCG/INH IN AEPB: 1.0000 | INHALATION_SPRAY | Freq: Every day | RESPIRATORY_TRACT | 5 refills | Status: DC

## 2016-02-18 NOTE — Patient Instructions (Addendum)
1) Complete Levaquin and prednisone as prescribed 2) Continue hydrocodone as needed for cough 3) Continue Omeprazole as prescribed 4) Chest Xray today to follow up on what I think was an infiltrate (opacity) in the lower part of your left lung 5) Begin Breo inhaler - one inhalation daily 6) continue albuterol inhaler (Pro-Air) as needed Follow up 03/03/16. I will call you if there is anything on the repeat CRX that requires further investigation

## 2016-02-18 NOTE — Progress Notes (Signed)
PULMONARY CONSULT NOTE  Requesting MD/Service:  Samule Dryobt Chauvin, PA Date of initial consultation: 02/18/16 Reason for consultation: persistent bronchitis  PT PROFILE: 60 y.o. F never smoker referred for persistent bronchitis symptoms.    HPI:  [id number] F who normally enjoys good health is referred for evaluation of 5 weeks of respiratory symptoms including DOE, hoarseness and rattling cough with difficulty expectorating sputum. She was initially treated with a Z pak and short course of prednisone with incomplete improvement and relapse of symptoms after completion of the treatments. At the time of the initial treatment, she underwent a CXR which she was told was normal. She was subsequently started on an albuterol MDI which she is using approx 3 times per day and which provides short term relief of her symptoms. She has more recently been started on levofloxacin and another course of prednisone for persistent symptoms. She had similar but more short-lived symptoms in September of this year and has a history of recurrent "bronchitis" over her lifetime. She also has had repeated sinus infections and URIs approx 2X per yr much of her adult life. She denies CP, fever, purulent sputum, hemoptysis, LE edema and calf tenderness.  No past medical history on file.  Past Surgical History:  Procedure Laterality Date  . BREAST BIOPSY Right 11/28/2014   stereotactic biopsy - negative  . HAND SURGERY Right 02/12/2015   Dr. Amanda PeaGramig; [facility]    MEDICATIONS: I have reviewed all medications and confirmed regimen as documented  Social History   Social History  . Marital status: Married    Spouse name: N/A  . Number of children: 2  . Years of education: College   Occupational History  . Full Time    Social History Main Topics  . Smoking status: Never Smoker  . Smokeless tobacco: Never Used  . Alcohol use Yes     Comment: Very Rarely  . Drug use: No  . Sexual activity: Not on file   Other  Topics Concern  . Not on file   Social History Narrative  . No narrative on file    Family History  Problem Relation Age of Onset  . Hypothyroidism Mother   . Dementia Mother   . Hypertension Father   . Diabetes Father   . Hypertension Sister   . Breast cancer Cousin   . Breast cancer Cousin     ROS: No fever, myalgias/arthralgias, unexplained weight loss or weight gain No new focal weakness or sensory deficits No otalgia, hearing loss, visual changes, nasal and sinus symptoms, mouth and throat problems No neck pain or adenopathy No abdominal pain, N/V/D, diarrhea, change in bowel pattern No dysuria, change in urinary pattern   Vitals:   02/18/16 0948  BP: 126/82  Pulse: 78  Temp: 97.8 F (36.6 C)  TempSrc: Oral  SpO2: 97%  Weight: 228 lb 12.8 oz (103.8 kg)  Height: 5\' 5"  (1.651 m)     EXAM:  Gen: WDWN, No overt respiratory distress HEENT: NCAT, sclera white, oropharynx normal, + rhinitis Neck: Supple without LAN, thyromegaly, JVD Lungs: breath sounds full with faint LLL crackles, percussion: normal, No wheezes Cardiovascular: RRR, no murmurs noted Abdomen: Soft, nontender, normal BS Ext: without clubbing, cyanosis, edema Neuro: CNs grossly intact, motor and sensory intact Skin: Limited exam, no lesions noted  DATA:   BMP Latest Ref Rng & Units 08/28/2015  Glucose 65 - 99 mg/dL 161(W103(H)  BUN 8 - 27 mg/dL 13  Creatinine 9.600.57 - 4.541.00 mg/dL 0.980.75  BUN/Creat Ratio  12 - 28 17  Sodium 134 - 144 mmol/L 141  Potassium 3.5 - 5.2 mmol/L 4.7  Chloride 96 - 106 mmol/L 102  CO2 18 - 29 mmol/L 23  Calcium 8.7 - 10.3 mg/dL 9.5    CBC Latest Ref Rng & Units 08/28/2015  WBC 3.4 - 10.8 x10E3/uL 8.0  Hematocrit 34.0 - 46.6 % 37.1  Platelets 150 - 379 x10E3/uL 320    CXR (01/24/16): vague hazy infiltrate in LLL    IMPRESSION:   1) Recurrent and persistent "bronchitis" with symptoms of cough and dyspnea that seem to respond to steroids, abx and albuterol - possible  asthma 2) Hoarseness - suspected GERD/LPR  PLAN:  1) Complete Levaquin and prednisone as prescribed 2) Continue hydrocodone as needed for cough 3) Continue Omeprazole as recently prescribed 4) Chest Xray today to follow up on what I think was an infiltrate (opacity) in the lower part of your left lung 5) Begin Breo 100/5 inhaler - one inhalation daily 6) continue albuterol inhaler (Pro-Air) as needed 7) Follow up 03/03/16.   ADD: CXR 02/18/16: The previously seen left basilar infiltrate has resolved in the interval. Stable density at the right cardiac border likely representing epicardial fat pad  Billy Fischeravid Destanie Tibbetts, MD PCCM service Mobile [phone] Pager [phone] 02/20/2016

## 2016-02-20 ENCOUNTER — Telehealth: Payer: Self-pay | Admitting: Pulmonary Disease

## 2016-02-20 ENCOUNTER — Other Ambulatory Visit: Payer: Self-pay

## 2016-02-20 DIAGNOSIS — J45901 Unspecified asthma with (acute) exacerbation: Secondary | ICD-10-CM

## 2016-02-20 MED ORDER — LEVOFLOXACIN 500 MG PO TABS: 500.0000 mg | ORAL_TABLET | Freq: Every day | ORAL | 0 refills | Status: DC

## 2016-02-20 NOTE — Telephone Encounter (Signed)
What was her dosing of levaquin and regimen before today?

## 2016-02-20 NOTE — Telephone Encounter (Signed)
On 02-14-16 her PCP prescribed prednisone 20mg  3 tablets X 4 days, 2 tablets X 2 4 days, 1 tablet X 4 day then stop and levaquin 500mg  X 7 days.

## 2016-02-20 NOTE — Telephone Encounter (Signed)
Patient is returning phone call.  °

## 2016-02-20 NOTE — Telephone Encounter (Signed)
Dr. Belia HemanKasa  Please Advise- Sick message  Pt. Called stating she is still coughing with greenish phlegm, congestion and hoarse, some sob she denies wheezing,fever. She finished her last pill of her Levaquin today and is still on her prednisone. She is concerned that she is still not 100% better and wanted to know if she could have another rx of levaquin

## 2016-02-20 NOTE — Telephone Encounter (Signed)
Spoke with pt. And informed her of Kasa recc. She understood. The rx was sent in to the pharmacy of choice. Nothing further is needed at this time.

## 2016-02-20 NOTE — Telephone Encounter (Signed)
Patient called and is feeling better but still coughing up congestion, no fever and still hoarse. Feels like she needs another course of antibiotics. Please call patient.

## 2016-02-20 NOTE — Telephone Encounter (Signed)
lmtcb X1 

## 2016-02-20 NOTE — Telephone Encounter (Signed)
continue Levaquin 500 mg for 3 more days

## 2016-03-03 ENCOUNTER — Encounter: Payer: Self-pay | Admitting: Pulmonary Disease

## 2016-03-03 ENCOUNTER — Ambulatory Visit (INDEPENDENT_AMBULATORY_CARE_PROVIDER_SITE_OTHER): Payer: BC Managed Care – PPO | Admitting: Pulmonary Disease

## 2016-03-03 VITALS — BP 130/80 | HR 83 | Ht 65.0 in | Wt 231.8 lb

## 2016-03-03 DIAGNOSIS — R059 Cough, unspecified: Secondary | ICD-10-CM

## 2016-03-03 DIAGNOSIS — R05 Cough: Secondary | ICD-10-CM

## 2016-03-03 DIAGNOSIS — R0600 Dyspnea, unspecified: Secondary | ICD-10-CM | POA: Diagnosis not present

## 2016-03-03 MED ORDER — HYDROCOD POLST-CPM POLST ER 10-8 MG/5ML PO SUER: 5.0000 mL | Freq: Two times a day (BID) | ORAL | 0 refills | Status: DC | PRN

## 2016-03-03 NOTE — Patient Instructions (Signed)
The following regimen should be adhered to strictly until I see you back  1) Breo inhaler daily 2) Flonase inhaler - 2 sprays per nostril each day 3) Omeprazole (Prilosec) 20 mg twice a day 4) Albuterol (Pro-air) inhaler as needed 5) I have prescribed Tussionex as a cough suppressant to be used as needed, especially before bedtime  Follow up in 3-4 weeks

## 2016-03-03 NOTE — Progress Notes (Signed)
PULMONARY OFFICE FOLLOW UP NOTE  Requesting MD/Service:  Samule Dryobt Chauvin, PA Date of initial consultation: 02/18/16 Reason for consultation: persistent bronchitis  PT PROFILE: 61 y.o. F never smoker referred for persistent bronchitis symptoms responsive to prednisone and antibiotics  DATA: CXR 01/24/16: Vague LLL infiltrate CXR 02/18/16: NACPD Spirometry 03/03/16: FVC 2.66 liters (79% pred), FEV1 2.07 liters (80% pred)  SUBJ:  Was improving and thinks that the Breo inhaler helped "a lot" but has has a relapse in symptoms of cough, chest congestion, SOB and scant green sputum over past several days. "Ran out" of Breo inhaler but states that she used it up until yesterday. Denies fever, CP, hemoptysis, LE edema, calf tenderness. Using Omeprazole only once a day (was prescribed twice a day).   OBJ:  Vitals:   03/03/16 0908  BP: 130/80  Pulse: 83  SpO2: 97%  Weight: 231 lb 12.8 oz (105.1 kg)  Height: 5\' 5"  (1.651 m)     EXAM:  Gen: WDWN, No overt dyspnea but frequent dry, hacking cough HEENT: NCAT, sclera white, oropharynx normal, + rhinitis Neck: no JVD Lungs: slightly coarse BS, no wheezes Cardiovascular: RRR, no murmurs noted Abdomen: Soft, nontender, normal BS Ext: without clubbing, cyanosis, edema Neuro: CNs grossly intact, motor and sensory intact  DATA:  Current Outpatient Prescriptions on File Prior to Visit  Medication Sig Dispense Refill  . albuterol (PROVENTIL HFA;VENTOLIN HFA) 108 (90 Base) MCG/ACT inhaler Inhale 2 puffs into the lungs every 4 (four) hours as needed for wheezing or shortness of breath. 1 Inhaler 1  . docusate sodium (COLACE) 100 MG capsule Take 100 mg by mouth 2 (two) times daily.    . fluticasone (FLONASE) 50 MCG/ACT nasal spray Place into the nose.    . fluticasone furoate-vilanterol (BREO ELLIPTA) 100-25 MCG/INH AEPB Inhale 1 puff into the lungs daily. 60 each 5  . levothyroxine (SYNTHROID, LEVOTHROID) 75 MCG tablet TAKE 1 TABLET BY MOUTH EVERY  DAY 90 tablet 1  . montelukast (SINGULAIR) 10 MG tablet TAKE 1 TABLET BY MOUTH EVERY DAY 90 tablet 1  . omeprazole (PRILOSEC) 20 MG capsule TAKE 1 CAPSULE (20 MG TOTAL) BY MOUTH 2 (TWO) TIMES DAILY BEFORE A MEAL. 180 capsule 1  . sertraline (ZOLOFT) 100 MG tablet TAKE 1 TABLET BY MOUTH EVERY EVENING 90 tablet 1   No current facility-administered medications on file prior to visit.     BMP Latest Ref Rng & Units 08/28/2015  Glucose 65 - 99 mg/dL 161(W103(H)  BUN 8 - 27 mg/dL 13  Creatinine 9.600.57 - 4.541.00 mg/dL 0.980.75  BUN/Creat Ratio 12 - 28 17  Sodium 134 - 144 mmol/L 141  Potassium 3.5 - 5.2 mmol/L 4.7  Chloride 96 - 106 mmol/L 102  CO2 18 - 29 mmol/L 23  Calcium 8.7 - 10.3 mg/dL 9.5    CBC Latest Ref Rng & Units 08/28/2015  WBC 3.4 - 10.8 x10E3/uL 8.0  Hematocrit 34.0 - 46.6 % 37.1  Platelets 150 - 379 x10E3/uL 320    CXR: NNF  IMPRESSION:   1) Cough 2) Dyspnea 3) Possible asthma  I have some concerns re: compliance with prescribed therapies  PLAN:  The following regimen should be adhered to strictly until I see her back  1) Breo 100-25 inhaler daily 2) Flonase inhaler - 2 sprays per nostril each day 3) Omeprazole (Prilosec) 20 mg twice a day 4) Albuterol (Pro-air) inhaler as needed 5) I have prescribed Tussionex as a cough suppressant to be used as needed, especially before bedtime  Follow up in 3-4 weeks  Billy Fischer, MD PCCM service Mobile [phone] Pager [phone] 03/03/2016

## 2016-04-10 ENCOUNTER — Encounter: Payer: Self-pay | Admitting: Family Medicine

## 2016-04-10 ENCOUNTER — Ambulatory Visit (INDEPENDENT_AMBULATORY_CARE_PROVIDER_SITE_OTHER): Payer: BC Managed Care – PPO | Admitting: Family Medicine

## 2016-04-10 VITALS — BP 128/90 | HR 82 | Temp 98.7°F | Ht 65.0 in | Wt 231.6 lb

## 2016-04-10 DIAGNOSIS — K219 Gastro-esophageal reflux disease without esophagitis: Secondary | ICD-10-CM

## 2016-04-10 DIAGNOSIS — J309 Allergic rhinitis, unspecified: Secondary | ICD-10-CM

## 2016-04-10 DIAGNOSIS — F419 Anxiety disorder, unspecified: Secondary | ICD-10-CM | POA: Diagnosis not present

## 2016-04-10 DIAGNOSIS — E039 Hypothyroidism, unspecified: Secondary | ICD-10-CM | POA: Diagnosis not present

## 2016-04-10 DIAGNOSIS — R030 Elevated blood-pressure reading, without diagnosis of hypertension: Secondary | ICD-10-CM

## 2016-04-10 DIAGNOSIS — J45909 Unspecified asthma, uncomplicated: Secondary | ICD-10-CM

## 2016-04-10 LAB — TSH: TSH: 2.86 u[IU]/mL (ref 0.35–4.50)

## 2016-04-10 MED ORDER — PANTOPRAZOLE SODIUM 40 MG PO TBEC: 40.0000 mg | DELAYED_RELEASE_TABLET | Freq: Every day | ORAL | 3 refills | Status: DC

## 2016-04-10 NOTE — Patient Instructions (Signed)
Nice to meet you. We will check your thyroid function test today. You can try adding Claritin or Zyrtec for your allergies symptoms. Try using your albuterol inhaler if you have significant coughing fits to see if this would be beneficial. You should follow up with your pulmonologist as scheduled. If your eyes do not improve you should follow up with your optometrist.

## 2016-04-10 NOTE — Assessment & Plan Note (Signed)
Check TSH.  Continue Synthroid. 

## 2016-04-10 NOTE — Assessment & Plan Note (Signed)
Approved on recheck. Discussed monitoring at home. Has been very well controlled previously. Discussed that if it consistently runs greater than 140/90 at home she should contact us.

## 2016-04-10 NOTE — Assessment & Plan Note (Signed)
Relatively well-controlled. Continue Zoloft.

## 2016-04-10 NOTE — Progress Notes (Signed)
Diane AlarEric Shantae Vantol, MD Phone: [phone]  Diane Walsh is a 61 y.o. female who presents today for new patient visit.  Anxiety: Patient notes generalized worries. Has been on Zoloft for some time. This has been quite beneficial. She notes no depression. Notes she worries about her kids and her mother. Also had anxiety following her father's death several years ago.  Patient notes she has reactive airway disease. She was diagnosed with pneumonia in December. She's been treated with breo and rare albuterol use. Does note continued residual cough. Some mild shortness of breath that is much improved from December. Occasionally coughing some mucus up in the morning. She does note some reflux symptoms with burning at times. Does take Prilosec. Does still get symptoms on the Prilosec at times.  Allergic rhinitis: Notes some rhinorrhea and postnasal drip. This is chronic. Notes her eyes are little red and occasionally matted shut. She saw her optometrist for that and gave her an antibiotic and steroid drops to use. This did help though it came back after discontinuing use. Notes eyes are little bit itchy though no pain. No vision changes.  Patient reports no issues with blood pressure in the past. No chest pain or edema.  Active Ambulatory Problems    Diagnosis Date Noted  . Allergic rhinitis 08/23/2005  . Anxiety 03/18/2015  . Acid reflux 08/23/2005  . Adult hypothyroidism 08/23/2005  . RAD (reactive airway disease) 03/18/2015  . Esophagitis, reflux 03/18/2015  . Acne erythematosa 08/23/2005  . Avitaminosis D 07/13/2008  . Sinusitis, acute 07/13/2015  . Conjunctivitis, acute 07/13/2015  . Annual physical exam 08/27/2015  . Elevated blood pressure reading 04/10/2016   Resolved Ambulatory Problems    Diagnosis Date Noted  . No Resolved Ambulatory Problems   Past Medical History:  Diagnosis Date  . Allergic rhinitis   . Chickenpox   . Depression   . GERD (gastroesophageal reflux disease)    . Stress incontinence   . Thyroid disease   . UTI (urinary tract infection)     Family History  Problem Relation Age of Onset  . Hypothyroidism Mother   . Dementia Mother   . Hypertension Father   . Diabetes Father   . Hypertension Sister   . Breast cancer Cousin   . Breast cancer Cousin     Social History   Social History  . Marital status: Married    Spouse name: N/A  . Number of children: 2  . Years of education: College   Occupational History  . Full Time    Social History Main Topics  . Smoking status: Never Smoker  . Smokeless tobacco: Never Used  . Alcohol use Yes     Comment: Very Rarely  . Drug use: No  . Sexual activity: Not on file   Other Topics Concern  . Not on file   Social History Narrative  . No narrative on file    ROS  General:  Negative for nexplained weight loss, fever Skin: Negative for new or changing mole, sore that won't heal HEENT: Negative for trouble hearing, trouble seeing, ringing in ears, mouth sores, hoarseness, change in voice, dysphagia. CV:  Negative for chest pain, dyspnea, edema, palpitations Resp: Positive for cough, dyspnea, negative for hemoptysis GI: Positive for constipation, Negative for nausea, vomiting, diarrhea, abdominal pain, melena, hematochezia. GU: Positive for incontinence, Negative for dysuria, urinary hesitance, hematuria, vaginal or penile discharge, polyuria, sexual difficulty, lumps in testicle or breasts MSK: Negative for muscle cramps or aches, joint pain or  swelling Neuro: Negative for headaches, weakness, numbness, dizziness, passing out/fainting Psych: Positive for anxiety, Negative for depression, memory problems  Objective  Physical Exam Vitals:   04/10/16 0807 04/10/16 0823  BP: (!) 148/98 128/90  Pulse: 82   Temp: 98.7 F (37.1 C)     BP Readings from Last 3 Encounters:  04/10/16 128/90  03/03/16 130/80  02/18/16 126/82   Wt Readings from Last 3 Encounters:  04/10/16 231 lb 9.6  oz (105.1 kg)  03/03/16 231 lb 12.8 oz (105.1 kg)  02/18/16 228 lb 12.8 oz (103.8 kg)    Physical Exam  Constitutional: No distress.  HENT:  Head: Normocephalic and atraumatic.  Mouth/Throat: Oropharynx is clear and moist. No oropharyngeal exudate.  Normal TMs bilaterally  Eyes: Pupils are equal, round, and reactive to light.  Mild conjunctival erythema with no discharge, no corneal lesions noted on gross exam  Cardiovascular: Normal rate, regular rhythm and normal heart sounds.   Pulmonary/Chest: Effort normal and breath sounds normal.  Abdominal: Soft. Bowel sounds are normal. She exhibits no distension. There is no tenderness. There is no rebound and no guarding.  Musculoskeletal: She exhibits no edema.  Neurological: She is alert. Gait normal.  Skin: Skin is warm and dry. She is not diaphoretic.  Psychiatric:  Affect and mood normal at this time     Assessment/Plan:   Allergic rhinitis Suspect allergic rhinitis and conjunctivitis. She will continue Flonase and Singulair. We will add Claritin or Zyrtec. If not improving with the conjunctivitis she will follow-up with her optometrist.  Acid reflux Patient with history of acid reflux. This could be contributing to her cough. Benign abdominal exam. We will change to Protonix. If not improving by follow-up could consider referral to GI.  Adult hypothyroidism Check TSH. Continue Synthroid.  Anxiety Relatively well-controlled. Continue Zoloft.  Elevated blood pressure reading Approved on recheck. Discussed monitoring at home. Has been very well controlled previously. Discussed that if it consistently runs greater than 140/90 at home she should contact us.  RAD (reactive airway disease) Discussed the cough may be related to her reactive airway disease. Overall she has been improving. She will continue breo. She will try using albuterol if she gets into a coughing fit to see if this is beneficial. She'll keep her follow-up with  pulmonology.   Orders Placed This Encounter  Procedures  . TSH    Meds ordered this encounter  Medications  . pantoprazole (PROTONIX) 40 MG tablet    Sig: Take 1 tablet (40 mg total) by mouth daily.    Dispense:  30 tablet    Refill:  3     Diane Alar, MD [facility]

## 2016-04-10 NOTE — Assessment & Plan Note (Signed)
Discussed the cough may be related to her reactive airway disease. Overall she has been improving. She will continue breo. She will try using albuterol if she gets into a coughing fit to see if this is beneficial. She'll keep her follow-up with pulmonology.

## 2016-04-10 NOTE — Assessment & Plan Note (Signed)
Patient with history of acid reflux. This could be contributing to her cough. Benign abdominal exam. We will change to Protonix. If not improving by follow-up could consider referral to GI.

## 2016-04-10 NOTE — Progress Notes (Signed)
Pre visit review using our clinic review tool, if applicable. No additional management support is needed unless otherwise documented below in the visit note. 

## 2016-04-10 NOTE — Assessment & Plan Note (Signed)
Suspect allergic rhinitis and conjunctivitis. She will continue Flonase and Singulair. We will add Claritin or Zyrtec. If not improving with the conjunctivitis she will follow-up with her optometrist.

## 2016-04-12 ENCOUNTER — Other Ambulatory Visit: Payer: Self-pay | Admitting: Physician Assistant

## 2016-04-12 DIAGNOSIS — K219 Gastro-esophageal reflux disease without esophagitis: Secondary | ICD-10-CM

## 2016-04-13 ENCOUNTER — Telehealth: Payer: Self-pay | Admitting: *Deleted

## 2016-04-13 ENCOUNTER — Ambulatory Visit: Payer: BC Managed Care – PPO | Admitting: Pulmonary Disease

## 2016-04-13 NOTE — Telephone Encounter (Signed)
See result note.  

## 2016-04-13 NOTE — Telephone Encounter (Signed)
Pt requested lab results Pt contact [phone]

## 2016-05-01 ENCOUNTER — Encounter: Payer: Self-pay | Admitting: Pulmonary Disease

## 2016-05-01 ENCOUNTER — Ambulatory Visit (INDEPENDENT_AMBULATORY_CARE_PROVIDER_SITE_OTHER): Payer: BC Managed Care – PPO | Admitting: Pulmonary Disease

## 2016-05-01 VITALS — BP 138/72 | HR 80 | Wt 229.0 lb

## 2016-05-01 DIAGNOSIS — H1013 Acute atopic conjunctivitis, bilateral: Secondary | ICD-10-CM

## 2016-05-01 DIAGNOSIS — R05 Cough: Secondary | ICD-10-CM

## 2016-05-01 DIAGNOSIS — R059 Cough, unspecified: Secondary | ICD-10-CM

## 2016-05-01 MED ORDER — AZELASTINE HCL 0.05 % OP SOLN: 1.0000 [drp] | Freq: Two times a day (BID) | OPHTHALMIC | 10 refills | Status: DC | PRN

## 2016-05-01 NOTE — Progress Notes (Signed)
PULMONARY OFFICE FOLLOW UP NOTE  Requesting MD/Service:  Samule Dryobt Chauvin, PA Date of initial consultation: 02/18/16 Reason for consultation: persistent bronchitis  PT PROFILE: 61 y.o. F never smoker referred for persistent bronchitis symptoms responsive to prednisone and antibiotics  DATA: CXR 01/24/16: Vague LLL infiltrate CXR 02/18/16: NACPD Spirometry 03/03/16: FVC 2.66 liters (79% pred), FEV1 2.07 liters (80% pred)  SUBJ:  This is a routine reevaluation for chronic cough and mild exertional dyspnea. Her symptoms are dramatically improved, nearly completely resolved. She does have minimal lingering cough with minimal white mucus. She is not terribly troubled by this presently. Her chief complaint is severe conjunctivitis and tearing of both eyes which she attributes to allergies. Denies CP, fever, purulent sputum, hemoptysis, LE edema and calf tenderness.  OBJ:  Vitals:   05/01/16 1146  BP: 138/72  Pulse: 80  SpO2: 100%  Weight: 229 lb (103.9 kg)     EXAM:  Gen: WDWN, NAD HEENT: Bilateral conjunctivae are injected and hyperemic, nasal U Coso looks normal Lungs: Full breath sounds without wheezes or other adventitious sounds Cardiovascular: RRR, no murmurs Abdomen: Soft, nontender, normal BS Ext: without clubbing, cyanosis, edema Neuro: CNs grossly intact, motor and sensory intact  DATA:  Current Outpatient Prescriptions on File Prior to Visit  Medication Sig Dispense Refill  . albuterol (PROVENTIL HFA;VENTOLIN HFA) 108 (90 Base) MCG/ACT inhaler Inhale 2 puffs into the lungs every 4 (four) hours as needed for wheezing or shortness of breath. 1 Inhaler 1  . docusate sodium (COLACE) 100 MG capsule Take 100 mg by mouth 2 (two) times daily.    . fluticasone (FLONASE) 50 MCG/ACT nasal spray Place into the nose.    . fluticasone furoate-vilanterol (BREO ELLIPTA) 100-25 MCG/INH AEPB Inhale 1 puff into the lungs daily. 60 each 5  . levothyroxine (SYNTHROID, LEVOTHROID) 75 MCG  tablet TAKE 1 TABLET BY MOUTH EVERY DAY 90 tablet 1  . montelukast (SINGULAIR) 10 MG tablet TAKE 1 TABLET BY MOUTH EVERY DAY 90 tablet 1  . pantoprazole (PROTONIX) 40 MG tablet Take 1 tablet (40 mg total) by mouth daily. 30 tablet 3  . sertraline (ZOLOFT) 100 MG tablet TAKE 1 TABLET BY MOUTH EVERY EVENING 90 tablet 1   No current facility-administered medications on file prior to visit.     BMP Latest Ref Rng & Units 08/28/2015  Glucose 65 - 99 mg/dL 161(W103(H)  BUN 8 - 27 mg/dL 13  Creatinine 9.600.57 - 4.541.00 mg/dL 0.980.75  BUN/Creat Ratio 12 - 28 17  Sodium 134 - 144 mmol/L 141  Potassium 3.5 - 5.2 mmol/L 4.7  Chloride 96 - 106 mmol/L 102  CO2 18 - 29 mmol/L 23  Calcium 8.7 - 10.3 mg/dL 9.5    CBC Latest Ref Rng & Units 08/28/2015  WBC 3.4 - 10.8 x10E3/uL 8.0  Hematocrit 34.0 - 46.6 % 37.1  Platelets 150 - 379 x10E3/uL 320    CXR: NNF  IMPRESSION:   1) Cough - probably multifactorial. Presently well controlled. 2) minimal exertional dyspnea-controlled. 3) Possible asthma  4) allergic conjunctivitis  PLAN:  1) continue Breo 100-25 inhaler daily 2) continue Flonase inhaler - change to 1 spray per nostril each day 3) continue Omeprazole (Prilosec) 20 mg twice a day 4) continue Albuterol (Pro-air) inhaler as needed 5) Azelastine ophthalmic drops - one drop per eye twice a day as needed for conjunctivitis  Follow up in 3 months or sooner as needed  Billy Fischeravid Simonds, MD PCCM service Mobile [phone] Pager [phone] 05/01/2016

## 2016-05-01 NOTE — Patient Instructions (Signed)
Azelastine eye drops ordered - one drop twice a day as needed  Continue rest of meds as previously  Follow up in 3 months

## 2016-05-11 ENCOUNTER — Other Ambulatory Visit: Payer: Self-pay | Admitting: Family Medicine

## 2016-05-11 DIAGNOSIS — K219 Gastro-esophageal reflux disease without esophagitis: Secondary | ICD-10-CM

## 2016-05-11 DIAGNOSIS — J309 Allergic rhinitis, unspecified: Secondary | ICD-10-CM

## 2016-05-11 DIAGNOSIS — E039 Hypothyroidism, unspecified: Secondary | ICD-10-CM

## 2016-05-11 DIAGNOSIS — F419 Anxiety disorder, unspecified: Secondary | ICD-10-CM

## 2016-05-12 NOTE — Telephone Encounter (Signed)
Last OV 04/10/16 last filled  Levothyroxine 11/29/15 90 1rf Diane Walsh Sertraline 11/29/15 90 1rf by Joycelyn ManJennifer Walsh Omeprazole was changed to protonix Montelukast 11/29/15 90 1rf filled by Joycelyn ManJennifer Walsh

## 2016-05-20 ENCOUNTER — Telehealth: Payer: Self-pay | Admitting: Pulmonary Disease

## 2016-05-20 NOTE — Telephone Encounter (Signed)
Pt husband states their grandson has the fu, and he thinks pt has it also. States pt symptoms are tired, sore, headache, diarrhea, and vomiting. Please call.

## 2016-05-20 NOTE — Telephone Encounter (Signed)
Pt informed she needs to contact her PCP due to her symptoms. No symptoms had to with breathing such as coughing, SOB or wheezing. Pt verbalized understanding. Nothing further needed.

## 2016-05-21 ENCOUNTER — Telehealth: Payer: Self-pay | Admitting: Family Medicine

## 2016-05-21 ENCOUNTER — Ambulatory Visit
Admission: RE | Admit: 2016-05-21 | Discharge: 2016-05-21 | Disposition: A | Discharge status: Home / Self Care | Payer: BC Managed Care – PPO | Source: Ambulatory Visit | Attending: Family Medicine | Admitting: Family Medicine

## 2016-05-21 ENCOUNTER — Ambulatory Visit (INDEPENDENT_AMBULATORY_CARE_PROVIDER_SITE_OTHER): Payer: BC Managed Care – PPO | Admitting: Family Medicine

## 2016-05-21 ENCOUNTER — Encounter: Payer: Self-pay | Admitting: Family Medicine

## 2016-05-21 VITALS — BP 122/70 | HR 86 | Temp 99.3°F | Wt 228.6 lb

## 2016-05-21 DIAGNOSIS — R1031 Right lower quadrant pain: Secondary | ICD-10-CM

## 2016-05-21 DIAGNOSIS — R05 Cough: Secondary | ICD-10-CM

## 2016-05-21 DIAGNOSIS — Z9049 Acquired absence of other specified parts of digestive tract: Secondary | ICD-10-CM | POA: Insufficient documentation

## 2016-05-21 DIAGNOSIS — R059 Cough, unspecified: Secondary | ICD-10-CM

## 2016-05-21 LAB — COMPREHENSIVE METABOLIC PANEL
ALT: 14 U/L (ref 0–35)
AST: 19 U/L (ref 0–37)
Albumin: 3.9 g/dL (ref 3.5–5.2)
Alkaline Phosphatase: 96 U/L (ref 39–117)
BUN: 13 mg/dL (ref 6–23)
CALCIUM: 9.4 mg/dL (ref 8.4–10.5)
CO2: 26 meq/L (ref 19–32)
Chloride: 105 mEq/L (ref 96–112)
Creatinine, Ser: 0.89 mg/dL (ref 0.40–1.20)
GFR: 68.25 mL/min (ref >60.00)
GLUCOSE: 112 mg/dL — AB (ref 70–99)
Potassium: 3.7 mEq/L (ref 3.5–5.1)
Sodium: 137 mEq/L (ref 135–145)
Total Bilirubin: 0.9 mg/dL (ref 0.2–1.2)
Total Protein: 7.2 g/dL (ref 6.0–8.3)

## 2016-05-21 LAB — POCT INFLUENZA A/B
INFLUENZA A, POC: NEGATIVE
Influenza B, POC: NEGATIVE

## 2016-05-21 LAB — POCT I-STAT CREATININE: Creatinine, Ser: 1 mg/dL (ref 0.44–1.00)

## 2016-05-21 MED ORDER — IOPAMIDOL (ISOVUE-300) INJECTION 61%
100.0000 mL | Freq: Once | INTRAVENOUS | Status: AC | PRN
  Administered 2016-05-21: 100 mL via INTRAVENOUS

## 2016-05-21 MED ORDER — OSELTAMIVIR PHOSPHATE 75 MG PO CAPS: 75.0000 mg | ORAL_CAPSULE | Freq: Two times a day (BID) | ORAL | 0 refills | Status: DC

## 2016-05-21 NOTE — Progress Notes (Signed)
Pre visit review using our clinic review tool, if applicable. No additional management support is needed unless otherwise documented below in the visit note. 

## 2016-05-21 NOTE — Telephone Encounter (Signed)
Please contact the patient and let her know that her CT scan did not reveal any acute abnormalities. There is no cause for abdominal pain. I suspect she has a viral illness and possibly has the flu given her recent contact with her grandson. I will send in Tamiflu for her to start on. She should monitor and if not improving be reevaluated. She needs to stay well hydrated.

## 2016-05-21 NOTE — Patient Instructions (Signed)
Nice to see you. We'll get a CT scan of your abdomen and pelvis to evaluate for cause of your discomfort. You should try to stay well hydrated. If you have worsening abdominal pain or develop blood in her stool or you to help any new or changing symptoms please seek medical attention medially.

## 2016-05-21 NOTE — Progress Notes (Signed)
  Tommi Rumps, MD Phone: [phone]  Diane Walsh is a 61 y.o. female who presents today for same-day visit.  Patient notes less than 48 hours of nasal and sinus congestion with blowing green mucus out of her nose, body aches, feverishness, diarrhea, vomiting, nausea, and abdominal discomfort. She's coughing green mucus up. No shortness of breath. Describes her diarrhea as black and green. The vomit is nonbloody. She was in contact with her sick grandson on Monday and he was diagnosed with the flu. Still has her appendix. Is status post cholecystectomy.  PMH: nonsmoker.   ROS see history of present illness  Objective  Physical Exam Vitals:   05/21/16 1018  BP: 122/70  Pulse: 86  Temp: 99.3 F (37.4 C)    BP Readings from Last 3 Encounters:  05/21/16 122/70  05/01/16 138/72  04/10/16 128/90   Wt Readings from Last 3 Encounters:  05/21/16 228 lb 9.6 oz (103.7 kg)  05/01/16 229 lb (103.9 kg)  04/10/16 231 lb 9.6 oz (105.1 kg)    Physical Exam  Constitutional: No distress.  She is tired-appearing  HENT:  Head: Normocephalic and atraumatic.  Mouth/Throat: Oropharynx is clear and moist. No oropharyngeal exudate.  Eyes: Conjunctivae are normal. Pupils are equal, round, and reactive to light.  Cardiovascular: Normal rate, regular rhythm and normal heart sounds.   Pulmonary/Chest: Effort normal and breath sounds normal.  Abdominal: Soft. Bowel sounds are normal. She exhibits no distension. There is tenderness (right lower quadrant tenderness). There is no rebound and no guarding.  Genitourinary: Rectal exam shows guaiac negative stool.  Neurological: She is alert. Gait normal.  Skin: Skin is warm and dry. She is not diaphoretic.     Assessment/Plan: Please see individual problem list.  RLQ abdominal pain Patient with abdominal pain and upper respiratory symptoms. Has had vomiting and diarrhea. FOBT checked today was negative. Stool is brown. Given right lower  quadrant tenderness on exam and the fact that she still has her appendix we will obtain a CT abdomen and pelvis to evaluate this area. Could potentially be a viral illness. She had a negative flu test. Once the CT scan returns we will determine the next step in treatment. We'll check a CMP to evaluate electrolytes given her persistent diarrhea. We will contact her when the CT scan results.   Orders Placed This Encounter  Procedures  . CT Abdomen Pelvis W Contrast    Standing Status:   Future    Number of Occurrences:   1    Standing Expiration Date:   08/21/2017    Order Specific Question:   If indicated for the ordered procedure, I authorize the administration of contrast media per Radiology protocol    Answer:   Yes    Order Specific Question:   Reason for Exam (SYMPTOM  OR DIAGNOSIS REQUIRED)    Answer:   nausea, vomiting, diarrhea, RLQ pain and tenderness, dark stools    Order Specific Question:   Pref[facility]?    [facility]    Order Specific Question:   Call Results- Best Contact Number?    Answer:   [phone]/ [phone]/ patient can leave when done    Order Specific Question:   Radiology Contrast Protocol - do NOT remove file path    Answer:   [email or web address]  . Comp Met (CMET)  . POCT Influenza A/B    Tommi Rumps, MD [place]

## 2016-05-21 NOTE — Assessment & Plan Note (Signed)
Patient with abdominal pain and upper respiratory symptoms. Has had vomiting and diarrhea. FOBT checked today was negative. Stool is brown. Given right lower quadrant tenderness on exam and the fact that she still has her appendix we will obtain a CT abdomen and pelvis to evaluate this area. Could potentially be a viral illness. She had a negative flu test. Once the CT scan returns we will determine the next step in treatment. We'll check a CMP to evaluate electrolytes given her persistent diarrhea. We will contact her when the CT scan results.

## 2016-05-21 NOTE — Telephone Encounter (Signed)
Pt advised. She asked about aleve with tamiflu Dr.Cook was consulted and gave verbal okay to take. Pt was told if anything got worse to go to urgent care.

## 2016-06-02 ENCOUNTER — Other Ambulatory Visit: Payer: Self-pay | Admitting: Physician Assistant

## 2016-06-02 DIAGNOSIS — J309 Allergic rhinitis, unspecified: Secondary | ICD-10-CM

## 2016-06-02 NOTE — Telephone Encounter (Signed)
Last ov 05/21/16 Last filled 05/12/16 Please review. Thank you. sd

## 2016-07-17 ENCOUNTER — Ambulatory Visit: Payer: BC Managed Care – PPO | Admitting: Family Medicine

## 2016-07-24 ENCOUNTER — Ambulatory Visit: Payer: BC Managed Care – PPO | Admitting: Family Medicine

## 2016-08-07 ENCOUNTER — Emergency Department: Payer: BC Managed Care – PPO

## 2016-08-07 ENCOUNTER — Inpatient Hospital Stay
Admission: EM | Admit: 2016-08-07 | Discharge: 2016-08-10 | DRG: 872 | Disposition: A | Discharge status: Home / Self Care | Payer: BC Managed Care – PPO | Attending: Internal Medicine | Admitting: Internal Medicine

## 2016-08-07 ENCOUNTER — Telehealth: Payer: Self-pay | Admitting: Pulmonary Disease

## 2016-08-07 DIAGNOSIS — F329 Major depressive disorder, single episode, unspecified: Secondary | ICD-10-CM | POA: Diagnosis present

## 2016-08-07 DIAGNOSIS — Z79899 Other long term (current) drug therapy: Secondary | ICD-10-CM

## 2016-08-07 DIAGNOSIS — A419 Sepsis, unspecified organism: Secondary | ICD-10-CM

## 2016-08-07 DIAGNOSIS — J45909 Unspecified asthma, uncomplicated: Secondary | ICD-10-CM | POA: Diagnosis present

## 2016-08-07 DIAGNOSIS — R509 Fever, unspecified: Secondary | ICD-10-CM | POA: Diagnosis present

## 2016-08-07 DIAGNOSIS — J029 Acute pharyngitis, unspecified: Secondary | ICD-10-CM | POA: Diagnosis present

## 2016-08-07 DIAGNOSIS — E039 Hypothyroidism, unspecified: Secondary | ICD-10-CM | POA: Diagnosis present

## 2016-08-07 DIAGNOSIS — B001 Herpesviral vesicular dermatitis: Secondary | ICD-10-CM | POA: Diagnosis present

## 2016-08-07 DIAGNOSIS — A408 Other streptococcal sepsis: Principal | ICD-10-CM | POA: Diagnosis present

## 2016-08-07 DIAGNOSIS — K219 Gastro-esophageal reflux disease without esophagitis: Secondary | ICD-10-CM | POA: Diagnosis present

## 2016-08-07 LAB — COMPREHENSIVE METABOLIC PANEL
ALBUMIN: 4.1 g/dL (ref 3.5–5.0)
ALT: 15 U/L (ref 14–54)
AST: 23 U/L (ref 15–41)
Alkaline Phosphatase: 114 U/L (ref 38–126)
Anion gap: 10 (ref 5–15)
BILIRUBIN TOTAL: 1 mg/dL (ref 0.3–1.2)
BUN: 10 mg/dL (ref 6–20)
CHLORIDE: 102 mmol/L (ref 101–111)
CO2: 24 mmol/L (ref 22–32)
CREATININE: 0.86 mg/dL (ref 0.44–1.00)
Calcium: 8.9 mg/dL (ref 8.9–10.3)
GFR calc Af Amer: >60 mL/min (ref >60)
GFR calc non Af Amer: >60 mL/min (ref >60)
GLUCOSE: 111 mg/dL — AB (ref 65–99)
POTASSIUM: 4.3 mmol/L (ref 3.5–5.1)
Sodium: 136 mmol/L (ref 135–145)
TOTAL PROTEIN: 8 g/dL (ref 6.5–8.1)

## 2016-08-07 LAB — CBC WITH DIFFERENTIAL/PLATELET
Basophils Absolute: 0 10*3/uL (ref 0–0.1)
Basophils Relative: 0 %
Eosinophils Absolute: 0.2 10*3/uL (ref 0–0.7)
Eosinophils Relative: 2 %
HEMATOCRIT: 36.7 % (ref 35.0–47.0)
Hemoglobin: 12.3 g/dL (ref 12.0–16.0)
LYMPHS PCT: 6 %
Lymphs Abs: 0.7 10*3/uL — ABNORMAL LOW (ref 1.0–3.6)
MCH: 28.6 pg (ref 26.0–34.0)
MCHC: 33.6 g/dL (ref 32.0–36.0)
MCV: 84.9 fL (ref 80.0–100.0)
MONO ABS: 0.8 10*3/uL (ref 0.2–0.9)
MONOS PCT: 6 %
NEUTROS ABS: 11.5 10*3/uL — AB (ref 1.4–6.5)
Neutrophils Relative %: 86 %
Platelets: 288 10*3/uL (ref 150–440)
RBC: 4.32 MIL/uL (ref 3.80–5.20)
RDW: 15.8 % — AB (ref 11.5–14.5)
WBC: 13.2 10*3/uL — ABNORMAL HIGH (ref 3.6–11.0)

## 2016-08-07 LAB — URINALYSIS, ROUTINE W REFLEX MICROSCOPIC
BACTERIA UA: NONE SEEN
BILIRUBIN URINE: NEGATIVE
Glucose, UA: NEGATIVE mg/dL
Hgb urine dipstick: NEGATIVE
KETONES UR: NEGATIVE mg/dL
Nitrite: NEGATIVE
PH: 8 (ref 5.0–8.0)
Protein, ur: NEGATIVE mg/dL
Specific Gravity, Urine: 1.025 (ref 1.005–1.030)

## 2016-08-07 LAB — PROTIME-INR
INR: 1.1
Prothrombin Time: 14.2 seconds (ref 11.4–15.2)

## 2016-08-07 LAB — PROCALCITONIN

## 2016-08-07 LAB — TSH: TSH: 3.698 u[IU]/mL (ref 0.350–4.500)

## 2016-08-07 LAB — TROPONIN I: TROPONIN I: 0.04 ng/mL — AB (ref <0.03)

## 2016-08-07 LAB — POCT RAPID STREP A: Streptococcus, Group A Screen (Direct): NEGATIVE

## 2016-08-07 LAB — LACTIC ACID, PLASMA: Lactic Acid, Venous: 0.7 mmol/L (ref 0.5–1.9)

## 2016-08-07 LAB — LIPASE, BLOOD: LIPASE: 19 U/L (ref 11–51)

## 2016-08-07 MED ORDER — IOPAMIDOL (ISOVUE-370) INJECTION 76%
75.0000 mL | Freq: Once | INTRAVENOUS | Status: AC | PRN
  Administered 2016-08-07: 75 mL via INTRAVENOUS

## 2016-08-07 MED ORDER — ACETAMINOPHEN 500 MG PO TABS
1000.0000 mg | ORAL_TABLET | Freq: Once | ORAL | Status: AC
  Administered 2016-08-07: 1000 mg via ORAL
  Filled 2016-08-07: qty 2

## 2016-08-07 MED ORDER — PREDNISONE 20 MG PO TABS: 20.0000 mg | ORAL_TABLET | Freq: Every day | ORAL | 0 refills | Status: DC

## 2016-08-07 MED ORDER — FLUTICASONE FUROATE-VILANTEROL 100-25 MCG/INH IN AEPB
1.0000 | INHALATION_SPRAY | Freq: Every day | RESPIRATORY_TRACT | Status: DC
  Administered 2016-08-07 – 2016-08-10 (×4): 1 via RESPIRATORY_TRACT
  Filled 2016-08-07: qty 28

## 2016-08-07 MED ORDER — IBUPROFEN 600 MG PO TABS
600.0000 mg | ORAL_TABLET | Freq: Once | ORAL | Status: AC
  Administered 2016-08-07: 600 mg via ORAL
  Filled 2016-08-07: qty 1

## 2016-08-07 MED ORDER — AMOXICILLIN-POT CLAVULANATE 875-125 MG PO TABS
1.0000 | ORAL_TABLET | Freq: Two times a day (BID) | ORAL | Status: DC
  Administered 2016-08-08 (×2): 1 via ORAL
  Filled 2016-08-07 (×2): qty 1

## 2016-08-07 MED ORDER — SODIUM CHLORIDE 0.9 % IV BOLUS (SEPSIS)
1000.0000 mL | Freq: Once | INTRAVENOUS | Status: AC
  Administered 2016-08-07: 1000 mL via INTRAVENOUS

## 2016-08-07 MED ORDER — ALBUTEROL SULFATE (2.5 MG/3ML) 0.083% IN NEBU
2.5000 mg | INHALATION_SOLUTION | RESPIRATORY_TRACT | Status: DC | PRN
  Administered 2016-08-08 – 2016-08-09 (×2): 2.5 mg via RESPIRATORY_TRACT
  Filled 2016-08-07 (×2): qty 3

## 2016-08-07 MED ORDER — SODIUM CHLORIDE 0.9 % IV BOLUS (SEPSIS): 1000.0000 mL | Freq: Once | INTRAVENOUS | Status: DC

## 2016-08-07 MED ORDER — SERTRALINE HCL 100 MG PO TABS
100.0000 mg | ORAL_TABLET | Freq: Every evening | ORAL | Status: DC
  Administered 2016-08-07 – 2016-08-09 (×3): 100 mg via ORAL
  Filled 2016-08-07 (×3): qty 1

## 2016-08-07 MED ORDER — DEXTROSE 5 % IV SOLN
500.0000 mg | Freq: Once | INTRAVENOUS | Status: AC
  Administered 2016-08-07: 500 mg via INTRAVENOUS
  Filled 2016-08-07: qty 500

## 2016-08-07 MED ORDER — ALBUTEROL SULFATE (2.5 MG/3ML) 0.083% IN NEBU
2.5000 mg | INHALATION_SOLUTION | RESPIRATORY_TRACT | Status: DC
  Administered 2016-08-07: 2.5 mg via RESPIRATORY_TRACT
  Filled 2016-08-07: qty 3

## 2016-08-07 MED ORDER — DEXTROSE 5 % IV SOLN
1.0000 g | Freq: Once | INTRAVENOUS | Status: AC
  Administered 2016-08-07: 1 g via INTRAVENOUS
  Filled 2016-08-07: qty 10

## 2016-08-07 MED ORDER — ONDANSETRON HCL 4 MG/2ML IJ SOLN: 4.0000 mg | Freq: Four times a day (QID) | INTRAMUSCULAR | Status: DC | PRN

## 2016-08-07 MED ORDER — PANTOPRAZOLE SODIUM 40 MG PO TBEC
40.0000 mg | DELAYED_RELEASE_TABLET | Freq: Two times a day (BID) | ORAL | Status: DC
  Administered 2016-08-08 – 2016-08-10 (×5): 40 mg via ORAL
  Filled 2016-08-07 (×5): qty 1

## 2016-08-07 MED ORDER — DOCUSATE SODIUM 100 MG PO CAPS: 100.0000 mg | ORAL_CAPSULE | Freq: Two times a day (BID) | ORAL | Status: DC | PRN

## 2016-08-07 MED ORDER — MONTELUKAST SODIUM 10 MG PO TABS
10.0000 mg | ORAL_TABLET | Freq: Every day | ORAL | Status: DC
  Administered 2016-08-07 – 2016-08-10 (×4): 10 mg via ORAL
  Filled 2016-08-07 (×4): qty 1

## 2016-08-07 MED ORDER — SODIUM CHLORIDE 0.9 % IV SOLN
INTRAVENOUS | Status: DC
  Administered 2016-08-07 – 2016-08-09 (×4): via INTRAVENOUS

## 2016-08-07 MED ORDER — KETOTIFEN FUMARATE 0.025 % OP SOLN
1.0000 [drp] | Freq: Two times a day (BID) | OPHTHALMIC | Status: DC
  Administered 2016-08-07 – 2016-08-10 (×6): 1 [drp] via OPHTHALMIC
  Filled 2016-08-07: qty 5

## 2016-08-07 MED ORDER — ACETAMINOPHEN 650 MG RE SUPP: 650.0000 mg | Freq: Four times a day (QID) | RECTAL | Status: DC | PRN

## 2016-08-07 MED ORDER — ACETAMINOPHEN 325 MG PO TABS
650.0000 mg | ORAL_TABLET | Freq: Four times a day (QID) | ORAL | Status: DC | PRN
  Administered 2016-08-08 – 2016-08-09 (×2): 650 mg via ORAL
  Filled 2016-08-07 (×2): qty 2

## 2016-08-07 MED ORDER — ENOXAPARIN SODIUM 40 MG/0.4ML ~~LOC~~ SOLN
40.0000 mg | SUBCUTANEOUS | Status: DC
  Administered 2016-08-07 – 2016-08-10 (×3): 40 mg via SUBCUTANEOUS
  Filled 2016-08-07 (×3): qty 0.4

## 2016-08-07 MED ORDER — SODIUM CHLORIDE 0.9 % IV BOLUS (SEPSIS)
1800.0000 mL | Freq: Once | INTRAVENOUS | Status: AC
  Administered 2016-08-07: 1800 mL via INTRAVENOUS

## 2016-08-07 MED ORDER — PREDNISONE 20 MG PO TABS
20.0000 mg | ORAL_TABLET | Freq: Every day | ORAL | Status: DC
  Administered 2016-08-08 – 2016-08-10 (×3): 20 mg via ORAL
  Filled 2016-08-07 (×3): qty 1

## 2016-08-07 MED ORDER — ONDANSETRON HCL 4 MG PO TABS: 4.0000 mg | ORAL_TABLET | Freq: Four times a day (QID) | ORAL | Status: DC | PRN

## 2016-08-07 MED ORDER — LEVOTHYROXINE SODIUM 75 MCG PO TABS
75.0000 ug | ORAL_TABLET | Freq: Every day | ORAL | Status: DC
  Administered 2016-08-07 – 2016-08-10 (×3): 75 ug via ORAL
  Filled 2016-08-07 (×4): qty 1

## 2016-08-07 NOTE — Telephone Encounter (Signed)
Husband informed Prednisone has been sent  In. Nothing further needed.

## 2016-08-07 NOTE — ED Provider Notes (Signed)
[facility] Emergency Department Provider Note  ____________________________________________   First MD Initiated Contact with Patient 08/07/16 1500     (approximate)  I have reviewed the triage vital signs and the nursing notes.   HISTORY  Chief Complaint Fever and Weakness    HPI Diane Walsh is a 61 y.o. female who comes to the emergency department with 2 days of fever sinus congestion fatigue.She went to [facility] today and was noted to have a temperature 102.4 degrees and a heart rate in the 120s so she was sent to the emergency department further evaluation. She is able to eat and drink although with difficulty. Her grandchildren have had strep throat recently. She reports significant coryza. She denies abdominal pain nausea or vomiting. Her symptoms have been progressively worse. Nothing seems to make them better or worse in particular. She has mild to moderate sharp diffuse upper chest pain worse with coughing improved when not coughing.   Past Medical History:  Diagnosis Date  . Allergic rhinitis   . Chickenpox   . Depression   . GERD (gastroesophageal reflux disease)   . Stress incontinence   . Thyroid disease   . UTI (urinary tract infection)     Patient Active Problem List   Diagnosis Date Noted  . Fever 08/07/2016  . RLQ abdominal pain 05/21/2016  . Elevated blood pressure reading 04/10/2016  . Annual physical exam 08/27/2015  . Sinusitis, acute 07/13/2015  . Conjunctivitis, acute 07/13/2015  . Anxiety 03/18/2015  . RAD (reactive airway disease) 03/18/2015  . Esophagitis, reflux 03/18/2015  . Avitaminosis D 07/13/2008  . Allergic rhinitis 08/23/2005  . Acid reflux 08/23/2005  . Adult hypothyroidism 08/23/2005  . Acne erythematosa 08/23/2005    Past Surgical History:  Procedure Laterality Date  . BREAST BIOPSY Right 11/28/2014   stereotactic biopsy - negative  . CHOLECYSTECTOMY    . HAND SURGERY Right 02/12/2015   Dr. Amanda Pea; [facility]  . TONSILLECTOMY      Prior to Admission medications   Medication Sig Start Date End Date Taking? Authorizing Provider  fluticasone furoate-vilanterol (BREO ELLIPTA) 100-25 MCG/INH AEPB Inhale 1 puff into the lungs daily. 02/18/16  Yes Merwyn Katos, MD  levothyroxine (SYNTHROID, LEVOTHROID) 75 MCG tablet TAKE 1 TABLET BY MOUTH EVERY DAY 05/12/16  Yes Glori Luis, MD  montelukast (SINGULAIR) 10 MG tablet TAKE 1 TABLET BY MOUTH EVERY DAY 06/02/16  Yes Joycelyn Man M, PA-C  omeprazole (PRILOSEC) 20 MG capsule TAKE 1 CAPSULE (20 MG TOTAL) BY MOUTH 2 (TWO) TIMES DAILY BEFORE A MEAL. 05/12/16  Yes Glori Luis, MD  predniSONE (DELTASONE) 20 MG tablet Take 1 tablet (20 mg total) by mouth daily with breakfast. X 7 days 08/07/16  Yes Erin Fulling, MD  sertraline (ZOLOFT) 100 MG tablet TAKE 1 TABLET BY MOUTH EVERY EVENING 05/12/16  Yes Glori Luis, MD  albuterol (PROVENTIL HFA;VENTOLIN HFA) 108 (90 Base) MCG/ACT inhaler Inhale 2 puffs into the lungs every 4 (four) hours as needed for wheezing or shortness of breath. 02/14/16   Anola Gurney, PA  azelastine (OPTIVAR) 0.05 % ophthalmic solution Place 1 drop into both eyes 2 (two) times daily as needed. 05/01/16 05/01/17  Merwyn Katos, MD  docusate sodium (COLACE) 100 MG capsule Take 100 mg by mouth 2 (two) times daily.    [provider]  oseltamivir (TAMIFLU) 75 MG capsule Take 1 capsule (75 mg total) by mouth 2 (two) times daily. Patient not taking: Reported on  08/07/2016 05/21/16   Glori LuisSonnenberg, Eric G, MD  pantoprazole (PROTONIX) 40 MG tablet Take 1 tablet (40 mg total) by mouth daily. Patient not taking: Reported on 05/21/2016 04/10/16   Glori LuisSonnenberg, Eric G, MD    Allergies Patient has no known allergies.  Family History  Problem Relation Age of Onset  . Hypothyroidism Mother   . Dementia Mother   . Hypertension Father   . Diabetes Father   . Hypertension Sister   . Breast cancer  Cousin   . Breast cancer Cousin     Social History Social History  Substance Use Topics  . Smoking status: Never Smoker  . Smokeless tobacco: Never Used  . Alcohol use Yes     Comment: Very Rarely    Review of Systems Constitutional: Positive fevers Eyes: No visual changes. ENT: Positive sore throat. Cardiovascular: Positive chest pain. Respiratory: Positive shortness of breath. Gastrointestinal: No abdominal pain.  No nausea, no vomiting.  No diarrhea.  No constipation. Genitourinary: Negative for dysuria. Musculoskeletal: Negative for back pain. Skin: Negative for rash. Neurological: Negative for headaches, focal weakness or numbness.   ____________________________________________   PHYSICAL EXAM:  VITAL SIGNS: ED Triage Vitals  Enc Vitals Group     BP 08/07/16 1432 139/83     Pulse Rate 08/07/16 1432 (!) 108     Resp 08/07/16 1432 20     Temp 08/07/16 1432 (!) 100.8 F (38.2 C)     Temp Source 08/07/16 1432 Oral     SpO2 08/07/16 1432 94 %     Weight 08/07/16 1432 200 lb (90.7 kg)     Height 08/07/16 1432 5\' 5"  (1.651 m)     Head Circumference --      Peak Flow --      Pain Score 08/07/16 1431 7     Pain Loc --      Pain Edu? --      Excl. in GC? --     Constitutional: Alert and oriented x 4 Appears uncomfortable with slight diaphoresis and elevated respiratory rate although able to speak in full clear sentences Eyes: PERRL EOMI. Head: Atraumatic. Nose: No congestion/rhinnorhea. Mouth/Throat: No trismus uvula midline no pharyngeal erythema or exudate but she does have bilateral cervical lymphadenopathy that is tender. Normal tympanic membranes bilaterally Neck: No stridor.   Cardiovascular: Tachycardic rate, regular rhythm. Grossly normal heart sounds.  Good peripheral circulation. Respiratory: Increased respiratory effort with clear lungs Gastrointestinal: Soft nontender Musculoskeletal: No lower extremity edema   Neurologic:  Normal speech and  language. No gross focal neurologic deficits are appreciated. Skin:  Slight diaphoresis Psychiatric: Mood and affect are normal. Speech and behavior are normal.    ____________________________________________   DIFFERENTIAL  Strep pharyngitis, viral pharyngitis, pneumonia, influenza, viral syndrome, bacteremia, sepsis ____________________________________________   LABS (all labs ordered are listed, but only abnormal results are displayed)  Labs Reviewed  COMPREHENSIVE METABOLIC PANEL - Abnormal; Notable for the following:       Result Value   Glucose, Bld 111 (*)    All other components within normal limits  CBC WITH DIFFERENTIAL/PLATELET - Abnormal; Notable for the following:    WBC 13.2 (*)    RDW 15.8 (*)    Neutro Abs 11.5 (*)    Lymphs Abs 0.7 (*)    All other components within normal limits  URINALYSIS, ROUTINE W REFLEX MICROSCOPIC - Abnormal; Notable for the following:    Color, Urine YELLOW (*)    APPearance CLEAR (*)    Leukocytes, UA  TRACE (*)    Squamous Epithelial / LPF 0-5 (*)    All other components within normal limits  TROPONIN I - Abnormal; Notable for the following:    Troponin I 0.04 (*)    All other components within normal limits  CULTURE, BLOOD (ROUTINE X 2)  CULTURE, BLOOD (ROUTINE X 2)  URINE CULTURE  LACTIC ACID, PLASMA  PROTIME-INR  PROCALCITONIN  LIPASE, BLOOD  POCT RAPID STREP A    Elevated white count concerning for infection elevated troponin likely secondary to demand and not true primary coronary ischemia __________________________________________  EKG  ED ECG REPORT I, Merrily Brittle, the attending physician, personally viewed and interpreted this ECG.  Date: 08/07/2016 Rate: 111 Rhythm: normal sinus rhythm QRS Axis: normal Intervals: normal ST/T Wave abnormalities: normal Conduction Disturbances: none Narrative Interpretation: unremarkable  ____________________________________________  RADIOLOGY  Chest x-ray with no  acute disease. CT scan of the chest negative for large pulmonary embolism and negative for pneumonia but does have lymphadenopathy on the left neck ____________________________________________   PROCEDURES  Procedure(s) performed: no  Procedures  Critical Care performed:yes  CRITICAL CARE Performed by: Merrily Brittle   Total critical care time: 35 minutes  Critical care time was exclusive of separately billable procedures and treating other patients.  Critical care was necessary to treat or prevent imminent or life-threatening deterioration.  Critical care was time spent personally by me on the following activities: development of treatment plan with patient and/or surrogate as well as nursing, discussions with consultants, evaluation of patient's response to treatment, examination of patient, obtaining history from patient or surrogate, ordering and performing treatments and interventions, ordering and review of laboratory studies, ordering and review of radiographic studies, pulse oximetry and re-evaluation of patient's condition.   Observation: no ____________________________________________   INITIAL IMPRESSION / ASSESSMENT AND PLAN / ED COURSE  Pertinent labs & imaging results that were available during my care of the patient were reviewed by me and considered in my medical decision making (see chart for details).  The patient arrives febrile and uncomfortable appearing with relative hypoxia although clear lungs and a chest x-ray does not show pneumonia. At this point I'm concerned that she may have a pneumonia not visible on chest x-ray versus a pulmonary embolism for her unexplained hypoxia. CT chest is pending.  Fortunately the patient's CT chest is negative for pulmonary embolism but it does not explain her fever. It does show some left-sided neck lymphadenopathy. She is continuing to spike fevers despite antipyretics. At this point the patient will require inpatient  admission for continued IV hydration, IV antibiotics, and for cultures to come back.      ____________________________________________   FINAL CLINICAL IMPRESSION(S) / ED DIAGNOSES  Final diagnoses:  Sepsis, due to unspecified organism [facility])      NEW MEDICATIONS STARTED DURING THIS VISIT:  New Prescriptions   No medications on file     Note:  This document was prepared using Dragon voice recognition software and may include unintentional dictation errors.     Merrily Brittle, MD 08/07/16 [id number]

## 2016-08-07 NOTE — ED Notes (Signed)
RN called floor to see what the delay was in getting patients bed approved, Shelia informed RN that the charge nurse was in a room and would call me back

## 2016-08-07 NOTE — H&P (Signed)
Diane Walsh is an 61 y.o. female.   Chief Complaint: weakness HPI: the patient with past medical history of hypothyroidism presents to the emergency department complaining of fever and weakness for 5 days. The patient states that her grandchildren had been visiting at the start  Her symptoms began with sore throat and she developed fever as well as decreased appetite, weakness and dizziness. She admits to congestion in her head as well as purulent nasal drainage mixed with blood.laboratory evaluation in the emergency department was unremarkable, however the patient continued to have fever as well as tachycardia which prompted the emergency department staff to call the hospitalist service for admission.  Past Medical History:  Diagnosis Date  . Allergic rhinitis   . Chickenpox   . Depression   . GERD (gastroesophageal reflux disease)   . Stress incontinence   . Thyroid disease   . UTI (urinary tract infection)     Past Surgical History:  Procedure Laterality Date  . BREAST BIOPSY Right 11/28/2014   stereotactic biopsy - negative  . CHOLECYSTECTOMY    . HAND SURGERY Right 02/12/2015   Dr. Amedeo Plenty; [facility]  . TONSILLECTOMY      Family History  Problem Relation Age of Onset  . Hypothyroidism Mother   . Dementia Mother   . Hypertension Father   . Diabetes Father   . Hypertension Sister   . Breast cancer Cousin   . Breast cancer Cousin    Social History:  reports that she has never smoked. She has never used smokeless tobacco. She reports that she drinks alcohol. She reports that she does not use drugs.  Allergies: No Known Allergies  Medications Prior to Admission  Medication Sig Dispense Refill  . fluticasone furoate-vilanterol (BREO ELLIPTA) 100-25 MCG/INH AEPB Inhale 1 puff into the lungs daily. 60 each 5  . levothyroxine (SYNTHROID, LEVOTHROID) 75 MCG tablet TAKE 1 TABLET BY MOUTH EVERY DAY 90 tablet 1  . montelukast (SINGULAIR) 10 MG tablet TAKE 1 TABLET BY  MOUTH EVERY DAY 90 tablet 1  . omeprazole (PRILOSEC) 20 MG capsule TAKE 1 CAPSULE (20 MG TOTAL) BY MOUTH 2 (TWO) TIMES DAILY BEFORE A MEAL. 180 capsule 1  . predniSONE (DELTASONE) 20 MG tablet Take 1 tablet (20 mg total) by mouth daily with breakfast. X 7 days 7 tablet 0  . sertraline (ZOLOFT) 100 MG tablet TAKE 1 TABLET BY MOUTH EVERY EVENING 90 tablet 1  . albuterol (PROVENTIL HFA;VENTOLIN HFA) 108 (90 Base) MCG/ACT inhaler Inhale 2 puffs into the lungs every 4 (four) hours as needed for wheezing or shortness of breath. 1 Inhaler 1  . azelastine (OPTIVAR) 0.05 % ophthalmic solution Place 1 drop into both eyes 2 (two) times daily as needed. 6 mL 10  . docusate sodium (COLACE) 100 MG capsule Take 100 mg by mouth 2 (two) times daily.    Marland Kitchen oseltamivir (TAMIFLU) 75 MG capsule Take 1 capsule (75 mg total) by mouth 2 (two) times daily. (Patient not taking: Reported on 08/07/2016) 10 capsule 0  . pantoprazole (PROTONIX) 40 MG tablet Take 1 tablet (40 mg total) by mouth daily. (Patient not taking: Reported on 05/21/2016) 30 tablet 3    Results for orders placed or performed during the hospital encounter of 08/07/16 (from the past 48 hour(s))  Comprehensive metabolic panel     Status: Abnormal   Collection Time: 08/07/16  2:49 PM  Result Value Ref Range   Sodium 136 135 - 145 mmol/L   Potassium 4.3 3.5 -  5.1 mmol/L    Comment: HEMOLYSIS AT THIS LEVEL MAY AFFECT RESULT   Chloride 102 101 - 111 mmol/L   CO2 24 22 - 32 mmol/L   Glucose, Bld 111 (H) 65 - 99 mg/dL   BUN 10 6 - 20 mg/dL   Creatinine, Ser 0.86 0.44 - 1.00 mg/dL   Calcium 8.9 8.9 - 10.3 mg/dL   Total Protein 8.0 6.5 - 8.1 g/dL   Albumin 4.1 3.5 - 5.0 g/dL   AST 23 15 - 41 U/L   ALT 15 14 - 54 U/L   Alkaline Phosphatase 114 38 - 126 U/L   Total Bilirubin 1.0 0.3 - 1.2 mg/dL   GFR calc non Af Amer >60 >60 mL/min   GFR calc Af Amer >60 >60 mL/min    Comment: (NOTE) The eGFR has been calculated using the CKD EPI equation. This calculation  has not been validated in all clinical situations. eGFR's persistently <60 mL/min signify possible Chronic Kidney Disease.    Anion gap 10 5 - 15  Lactic acid, plasma     Status: None   Collection Time: 08/07/16  2:49 PM  Result Value Ref Range   Lactic Acid, Venous 0.7 0.5 - 1.9 mmol/L  CBC with Differential     Status: Abnormal   Collection Time: 08/07/16  2:49 PM  Result Value Ref Range   WBC 13.2 (H) 3.6 - 11.0 K/uL   RBC 4.32 3.80 - 5.20 MIL/uL   Hemoglobin 12.3 12.0 - 16.0 g/dL   HCT 36.7 35.0 - 47.0 %   MCV 84.9 80.0 - 100.0 fL   MCH 28.6 26.0 - 34.0 pg   MCHC 33.6 32.0 - 36.0 g/dL   RDW 15.8 (H) 11.5 - 14.5 %   Platelets 288 150 - 440 K/uL   Neutrophils Relative % 86 %   Neutro Abs 11.5 (H) 1.4 - 6.5 K/uL   Lymphocytes Relative 6 %   Lymphs Abs 0.7 (L) 1.0 - 3.6 K/uL   Monocytes Relative 6 %   Monocytes Absolute 0.8 0.2 - 0.9 K/uL   Eosinophils Relative 2 %   Eosinophils Absolute 0.2 0 - 0.7 K/uL   Basophils Relative 0 %   Basophils Absolute 0.0 0 - 0.1 K/uL  Protime-INR     Status: None   Collection Time: 08/07/16  2:49 PM  Result Value Ref Range   Prothrombin Time 14.2 11.4 - 15.2 seconds   INR 1.10   Troponin I     Status: Abnormal   Collection Time: 08/07/16  2:49 PM  Result Value Ref Range   Troponin I 0.04 (HH) <0.03 ng/mL    Comment: CRITICAL RESULT CALLED TO, READ BACK BY AND VERIFIED WITH ANGELA ROBBINS @ 1610 08/07/16 BY [facility]   Procalcitonin     Status: None   Collection Time: 08/07/16  2:49 PM  Result Value Ref Range   Procalcitonin <0.10 ng/mL    Comment:        Interpretation: PCT (Procalcitonin) <= 0.5 ng/mL: Systemic infection (sepsis) is not likely. Local bacterial infection is possible. (NOTE)         ICU PCT Algorithm               Non ICU PCT Algorithm    ----------------------------     ------------------------------         PCT < 0.25 ng/mL                 PCT < 0.1 ng/mL  Stopping of antibiotics            Stopping of antibiotics        strongly encouraged.               strongly encouraged.    ----------------------------     ------------------------------       PCT level decrease by               PCT < 0.25 ng/mL       >= 80% from peak PCT       OR PCT 0.25 - 0.5 ng/mL          Stopping of antibiotics                                             encouraged.     Stopping of antibiotics           encouraged.    ----------------------------     ------------------------------       PCT level decrease by              PCT >= 0.25 ng/mL       < 80% from peak PCT        AND PCT >= 0.5 ng/mL            Continuin g antibiotics                                              encouraged.       Continuing antibiotics            encouraged.    ----------------------------     ------------------------------     PCT level increase compared          PCT > 0.5 ng/mL         with peak PCT AND          PCT >= 0.5 ng/mL             Escalation of antibiotics                                          strongly encouraged.      Escalation of antibiotics        strongly encouraged.   Lipase, blood     Status: None   Collection Time: 08/07/16  2:49 PM  Result Value Ref Range   Lipase 19 11 - 51 U/L  TSH     Status: None   Collection Time: 08/07/16  2:49 PM  Result Value Ref Range   TSH 3.698 0.350 - 4.500 uIU/mL    Comment: Performed by a 3rd Generation assay with a functional sensitivity of <=0.01 uIU/mL.  POCT rapid strep A [facility])     Status: None   Collection Time: 08/07/16  4:15 PM  Result Value Ref Range   Streptococcus, Group A Screen (Direct) NEGATIVE NEGATIVE  Urinalysis, Routine w reflex microscopic     Status: Abnormal   Collection Time: 08/07/16  5:29 PM  Result Value Ref Range   Color, Urine YELLOW (A) YELLOW   APPearance CLEAR (A) CLEAR   Specific  Gravity, Urine 1.025 1.005 - 1.030   pH 8.0 5.0 - 8.0   Glucose, UA NEGATIVE NEGATIVE mg/dL   Hgb urine dipstick NEGATIVE NEGATIVE   Bilirubin Urine NEGATIVE  NEGATIVE   Ketones, ur NEGATIVE NEGATIVE mg/dL   Protein, ur NEGATIVE NEGATIVE mg/dL   Nitrite NEGATIVE NEGATIVE   Leukocytes, UA TRACE (A) NEGATIVE   RBC / HPF 0-5 0 - 5 RBC/hpf   WBC, UA 6-30 0 - 5 WBC/hpf   Bacteria, UA NONE SEEN NONE SEEN   Squamous Epithelial / LPF 0-5 (A) NONE SEEN   Mucous PRESENT    Dg Chest 2 View  Result Date: 08/07/2016 CLINICAL DATA:  Weakness and dizziness for 1 week with fever EXAM: CHEST  2 VIEW COMPARISON:  02/18/2016, 01/24/2016, 08/08/2014 FINDINGS: No focal consolidation or pleural effusion. Stable right middle lobe opacity. Normal heart size. No pneumothorax. Surgical clips in the upper abdomen. IMPRESSION: No active cardiopulmonary disease. Electronically Signed   By: Donavan Foil M.D.   On: 08/07/2016 15:21   Ct Angio Chest Pe W/cm &/or Wo Cm  Result Date: 08/07/2016 CLINICAL DATA:  Unexplained hypoxia. Weakness and dizziness with fever for the past 2 days. Cough. Congestion and sore throat since Sunday. EXAM: CT ANGIOGRAPHY CHEST WITH CONTRAST TECHNIQUE: Multidetector CT imaging of the chest was performed using the standard protocol during bolus administration of intravenous contrast. Multiplanar CT image reconstructions and MIPs were obtained to evaluate the vascular anatomy. CONTRAST:  75 cc of Isovue 370 in COMPARISON:  Plain films 08/07/2016.  No prior CT. FINDINGS: Cardiovascular: The quality of this exam for evaluation of pulmonary embolism is moderate. The bolus is suboptimally timed, with contrast centered in the SVC. No pulmonary embolism to the large segmental level. Aortic atherosclerosis. No evidence of dissection. Mild cardiomegaly, without pericardial effusion. Mediastinum/Nodes: Multiple small low left jugular and supraclavicular nodes. Example images 1-5/ series 4. Possible edema in this area. No mediastinal or hilar adenopathy. Lungs/Pleura: No pleural fluid.  Clear lungs. Upper Abdomen: Cholecystectomy. Left hepatic lobe cyst. Normal imaged  portions of the spleen, stomach, pancreas, adrenal glands, kidneys. Musculoskeletal: No acute osseous abnormality. Review of the MIP images confirms the above findings. IMPRESSION: 1. Moderate quality exam for pulmonary embolism. No large embolism identified. 2. Prominent nodes in the low left neck with possible edema in this region. Consider physical exam correlation to exclude cellulitis or other infectious/ inflammatory process. 3.  Aortic atherosclerosis. Electronically Signed   By: Abigail Miyamoto M.D.   On: 08/07/2016 17:04    Review of Systems  Constitutional: Positive for fever. Negative for chills.  HENT: Positive for nosebleeds and sore throat. Negative for tinnitus.   Eyes: Negative for blurred vision and redness.  Respiratory: Negative for cough and shortness of breath.   Cardiovascular: Negative for chest pain, palpitations, orthopnea and PND.  Gastrointestinal: Negative for abdominal pain, diarrhea, nausea and vomiting.  Genitourinary: Negative for dysuria, frequency and urgency.  Musculoskeletal: Negative for joint pain and myalgias.  Skin: Negative for rash.       No lesions  Neurological: Positive for dizziness and weakness. Negative for speech change and focal weakness.  Endo/Heme/Allergies: Does not bruise/bleed easily.       No temperature intolerance  Psychiatric/Behavioral: Negative for depression and suicidal ideas.    Blood pressure 116/65, pulse 91, temperature 98.5 F (36.9 C), temperature source Oral, resp. rate 18, height _0  (1.651 m), weight 109 kg (240 lb 6.4 oz), SpO2 97 %. Physical Exam  Vitals reviewed. Constitutional:  She is oriented to person, place, and time. She appears well-developed and well-nourished. No distress.  HENT:  Head: Normocephalic and atraumatic.  Nose: Right sinus exhibits maxillary sinus tenderness. Left sinus exhibits maxillary sinus tenderness.  Mouth/Throat: Uvula is midline and mucous membranes are normal. Posterior oropharyngeal  erythema present.  Eyes: Conjunctivae and EOM are normal. Pupils are equal, round, and reactive to light. No scleral icterus.  Neck: Normal range of motion. Neck supple. No JVD present. No tracheal deviation present. No thyromegaly present.  Cardiovascular: Normal rate, regular rhythm and normal heart sounds.  Exam reveals no gallop and no friction rub.   No murmur heard. Respiratory: Effort normal and breath sounds normal.  GI: Soft. Bowel sounds are normal. She exhibits no distension. There is no tenderness.  Genitourinary:  Genitourinary Comments: deferred  Musculoskeletal: Normal range of motion. She exhibits no edema.  Lymphadenopathy:    She has no cervical adenopathy.  Neurological: She is alert and oriented to person, place, and time. No cranial nerve deficit. She exhibits normal muscle tone.  Skin: Skin is warm and dry. No rash noted. No erythema.  Psychiatric: She has a normal mood and affect. Her behavior is normal. Judgment and thought content normal.     Assessment/Plan This is a 61 year old female admitted for fever. 1. Fever: Lungs and urine are clear. The patient has a mild leukocytosis and a normal differential on CBC. The patient meets criteria for sepsis but does not have a source thus her clinical condition is consistent with SIRS. Physical exam is significant for maxillary/ethmoidal sinus tenderness.  Thus, she may have an early sinus infection. Will discontinue IV antibiotics and start Augmentin.  2. Allergic rhinitis: Antecedent to current infection. Continue nasal Flonase as well as montelukast and Zaditor eyedrops. Apparently the patient was also prescribed prednisone today by her pulmonologist. We will continue steroids for sinus tenderness. 3. Reactive airway disease: continue inhaled corticosteroid with long acting bronchial agonist. Albuterol as needed. 4. Hypothyroidism: Continue Synthroid 5. Depression: Continue Zoloft 6. DVT prophylaxis: Lovenox 7. GI  prophylaxis: Pantoprazole per home regimen The patient is a full code. Time spent on admission orders and patient care approximately 45 minutes  Harrie Foreman, MD 08/07/2016, 10:04 PM

## 2016-08-07 NOTE — ED Notes (Signed)
FIRST NURSE NOTE:  [facility] and dizziness x 1 week. Temp in clinic 102.4, heart rate 123-135. BP 140/82. Decreased PO intake.

## 2016-08-07 NOTE — Telephone Encounter (Signed)
Prednisone 20 mg daily for 7 days 

## 2016-08-07 NOTE — Telephone Encounter (Signed)
Pt seen last by DS on 05/01/16 for cough. Below was his plan:  PLAN:  1) continue Breo 100-25 inhaler daily 2) continue Flonase inhaler - change to 1 spray per nostril each day 3) continue Omeprazole (Prilosec) 20 mg twice a day 4) continue Albuterol (Pro-air) inhaler as needed 5) Azelastine ophthalmic drops - one drop per eye twice a day as needed for conjunctivitis  Follow up in 3 months or sooner as needed    Pt calling stating since Sunday she's had this upper respiratory cough going on. She is wanting to know if there Is anything we can call in for her. Please advise.

## 2016-08-07 NOTE — ED Triage Notes (Signed)
Patient presents to the ED with weakness and dizziness with fever for the past 2 days.  Patient reports not feeling well with cough, congestion and sore throat since Sunday.  Patient appears very weak and tired and is tachypnic with fever in triage.  Skin is hot.  Patient is complaining of thirst.

## 2016-08-07 NOTE — Telephone Encounter (Signed)
LMOM for pt to return call. 

## 2016-08-07 NOTE — Telephone Encounter (Signed)
Pt calling stating since Sunday she's had this upper respiratory cough going on She is wanting to know if there Is anything we can call in for her Please advise

## 2016-08-08 DIAGNOSIS — A419 Sepsis, unspecified organism: Secondary | ICD-10-CM | POA: Diagnosis present

## 2016-08-08 DIAGNOSIS — E039 Hypothyroidism, unspecified: Secondary | ICD-10-CM | POA: Diagnosis present

## 2016-08-08 DIAGNOSIS — J029 Acute pharyngitis, unspecified: Secondary | ICD-10-CM | POA: Diagnosis present

## 2016-08-08 DIAGNOSIS — Z79899 Other long term (current) drug therapy: Secondary | ICD-10-CM | POA: Diagnosis not present

## 2016-08-08 DIAGNOSIS — I361 Nonrheumatic tricuspid (valve) insufficiency: Secondary | ICD-10-CM | POA: Diagnosis not present

## 2016-08-08 DIAGNOSIS — K219 Gastro-esophageal reflux disease without esophagitis: Secondary | ICD-10-CM | POA: Diagnosis present

## 2016-08-08 DIAGNOSIS — J45909 Unspecified asthma, uncomplicated: Secondary | ICD-10-CM | POA: Diagnosis present

## 2016-08-08 DIAGNOSIS — A408 Other streptococcal sepsis: Secondary | ICD-10-CM | POA: Diagnosis present

## 2016-08-08 DIAGNOSIS — F329 Major depressive disorder, single episode, unspecified: Secondary | ICD-10-CM | POA: Diagnosis present

## 2016-08-08 DIAGNOSIS — B001 Herpesviral vesicular dermatitis: Secondary | ICD-10-CM | POA: Diagnosis present

## 2016-08-08 LAB — BLOOD CULTURE ID PANEL (REFLEXED)
ACINETOBACTER BAUMANNII: NOT DETECTED
CANDIDA ALBICANS: NOT DETECTED
CANDIDA GLABRATA: NOT DETECTED
CANDIDA PARAPSILOSIS: NOT DETECTED
CANDIDA TROPICALIS: NOT DETECTED
Candida krusei: NOT DETECTED
ENTEROBACTER CLOACAE COMPLEX: NOT DETECTED
ENTEROBACTERIACEAE SPECIES: NOT DETECTED
Enterococcus species: NOT DETECTED
Escherichia coli: NOT DETECTED
Haemophilus influenzae: NOT DETECTED
KLEBSIELLA OXYTOCA: NOT DETECTED
KLEBSIELLA PNEUMONIAE: NOT DETECTED
LISTERIA MONOCYTOGENES: NOT DETECTED
Neisseria meningitidis: NOT DETECTED
Proteus species: NOT DETECTED
Pseudomonas aeruginosa: NOT DETECTED
STREPTOCOCCUS PYOGENES: DETECTED — AB
Serratia marcescens: NOT DETECTED
Staphylococcus aureus (BCID): NOT DETECTED
Staphylococcus species: NOT DETECTED
Streptococcus agalactiae: NOT DETECTED
Streptococcus pneumoniae: NOT DETECTED
Streptococcus species: DETECTED — AB

## 2016-08-08 MED ORDER — PENICILLIN G POTASSIUM 5000000 UNITS IJ SOLR
4.0000 10*6.[IU] | INTRAVENOUS | Status: DC
  Administered 2016-08-08 – 2016-08-10 (×11): 4 10*6.[IU] via INTRAVENOUS
  Filled 2016-08-08 (×15): qty 4

## 2016-08-08 NOTE — Progress Notes (Signed)
[facility] [place] [facility]   PATIENT NAME: Diane MoraleKathy Patriarca    MR#:  [id number]  DATE OF BIRTH:  1956-02-02  SUBJECTIVE: Admitted for fever, generalized weakness. Patient still has high fever. Has sinus pain, headache, complains of lymph node swelling on the left side of neck. Also some nausea.   CHIEF COMPLAINT:   Chief Complaint  Patient presents with  . Fever  . Weakness    REVIEW OF SYSTEMS:   ROS CONSTITUTIONAL:  Fever, has weakness.  EYES: No blurred or double vision.  Has Head ache. EARS, NOSE, AND THROAT: No tinnitus or ear pain. Patient  complaints of sinus pain in maxillary area, also complains of swelling RESPIRATORY: No cough, shortness of breath, wheezing or hemoptysis.  CARDIOVASCULAR: No chest pain, orthopnea, edema.  GASTROINTESTINAL:has  nausea,  No vomiting, diarrhea or abdominal pain.  GENITOURINARY: No dysuria, hematuria.  ENDOCRINE: No polyuria, nocturia,  HEMATOLOGY: No anemia, easy bruising or bleeding SKIN: No rash or lesion. MUSCULOSKELETAL: No joint pain or arthritis.   NEUROLOGIC: No tingling, numbness, weakness.  PSYCHIATRY: No anxiety or depression.   DRUG ALLERGIES:  No Known Allergies  VITALS:  Blood pressure (!) 111/59, pulse (!) 103, temperature (!) 101.6 F (38.7 C), temperature source Oral, resp. rate (!) 22, height 5\' 5"  (1.651 m), weight 109 kg (240 lb 6.4 oz), SpO2 95 %.  PHYSICAL EXAMINATION:  GENERAL:  61 y.o.-year-old patient lying in the bed with no acute distress.  EYES: Pupils equal, round, reactive to light  No scleral icterus. Extraocular muscles intact.  HEENT: Head atraumatic, normocephalic. Oropharynx and nasopharynx clear. Patient has maxillary sinus tenderness . NECK:  Supple, no jugular venous distention. No thyroid enlargement, slight tenderness in left sternocledomastoid area.small lymphnode  tender but not red or swollen..  LUNGS: Normal breath sounds bilaterally, no wheezing,  rales,rhonchi or crepitation. No use of accessory muscles of respiration.  CARDIOVASCULAR: S1, S2 normal. No murmurs, rubs, or gallops.  ABDOMEN: Soft, nontender, nondistended. Bowel sounds present. No organomegaly or mass.  EXTREMITIES: No pedal edema, cyanosis, or clubbing.  NEUROLOGIC: Cranial nerves II through XII are intact. Muscle strength 5/5 in all extremities. Sensation intact. Gait not checked.  PSYCHIATRIC: The patient is alert and oriented x 3.  SKIN: No obvious rash, lesion, or ulcer.    LABORATORY PANEL:   CBC  Recent Labs Lab 08/07/16 1449  WBC 13.2*  HGB 12.3  HCT 36.7  PLT 288   ------------------------------------------------------------------------------------------------------------------  Chemistries   Recent Labs Lab 08/07/16 1449  NA 136  K 4.3  CL 102  CO2 24  GLUCOSE 111*  BUN 10  CREATININE 0.86  CALCIUM 8.9  AST 23  ALT 15  ALKPHOS 114  BILITOT 1.0   ------------------------------------------------------------------------------------------------------------------  Cardiac Enzymes  Recent Labs Lab 08/07/16 1449  TROPONINI 0.04*   ------------------------------------------------------------------------------------------------------------------  RADIOLOGY:  Dg Chest 2 View  Result Date: 08/07/2016 CLINICAL DATA:  Weakness and dizziness for 1 week with fever EXAM: CHEST  2 VIEW COMPARISON:  02/18/2016, 01/24/2016, 08/08/2014 FINDINGS: No focal consolidation or pleural effusion. Stable right middle lobe opacity. Normal heart size. No pneumothorax. Surgical clips in the upper abdomen. IMPRESSION: No active cardiopulmonary disease. Electronically Signed   By: Jasmine PangKim  Fujinaga M.D.   On: 08/07/2016 15:21   Ct Angio Chest Pe W/cm &/or Wo Cm  Result Date: 08/07/2016 CLINICAL DATA:  Unexplained hypoxia. Weakness and dizziness with fever for the past 2 days. Cough. Congestion and sore throat since Sunday. EXAM: CT ANGIOGRAPHY  CHEST WITH CONTRAST  TECHNIQUE: Multidetector CT imaging of the chest was performed using the standard protocol during bolus administration of intravenous contrast. Multiplanar CT image reconstructions and MIPs were obtained to evaluate the vascular anatomy. CONTRAST:  75 cc of Isovue 370 in COMPARISON:  Plain films 08/07/2016.  No prior CT. FINDINGS: Cardiovascular: The quality of this exam for evaluation of pulmonary embolism is moderate. The bolus is suboptimally timed, with contrast centered in the SVC. No pulmonary embolism to the large segmental level. Aortic atherosclerosis. No evidence of dissection. Mild cardiomegaly, without pericardial effusion. Mediastinum/Nodes: Multiple small low left jugular and supraclavicular nodes. Example images 1-5/ series 4. Possible edema in this area. No mediastinal or hilar adenopathy. Lungs/Pleura: No pleural fluid.  Clear lungs. Upper Abdomen: Cholecystectomy. Left hepatic lobe cyst. Normal imaged portions of the spleen, stomach, pancreas, adrenal glands, kidneys. Musculoskeletal: No acute osseous abnormality. Review of the MIP images confirms the above findings. IMPRESSION: 1. Moderate quality exam for pulmonary embolism. No large embolism identified. 2. Prominent nodes in the low left neck with possible edema in this region. Consider physical exam correlation to exclude cellulitis or other infectious/ inflammatory process. 3.  Aortic atherosclerosis. Electronically Signed   By: Jeronimo Greaves M.D.   On: 08/07/2016 17:04    EKG:   Orders placed or performed during the hospital encounter of 08/07/16  . EKG 12-Lead  . EKG 12-Lead  . ED EKG  . ED EKG    1. fever likely secondary to sinusitis and possible lymphadenitis: Continue Augmentin, if the fever doesn't subside and still having neck pain Will get CT of lymph node. Continue IV fluids, IV antibiotics, follow blood cultures   #2 reactive airway disease: Continue inhaled steroids, albuterol #3. hypothyroidism: Cultures and  thyroid #4 .depression: Continue Zoloft   D/w RN    All the records are reviewed and case discussed with Care Management/Social Workerr. Management plans discussed with the patient, family and they are in agreement.  CODE STATUS: full  TOTAL TIME TAKING CARE OF THIS PATIENT: .   POSSIBLE D/C IN 1-2 DAYS, DEPENDING ON CLINICAL CONDITION.   Katha Hamming M.D on 08/08/2016 at 8:05 AM  Between 7am to 6pm - Pager - [phone]  After 6pm go to [email or web address] - password [email or web address]  [facility]  Office  [phone]  CC: Primary care physician; Glori Luis, MD   Note: This dictation was prepared with Dragon dictation along with smaller phrase technology. Any transcriptional errors that result from this process are unintentional.

## 2016-08-08 NOTE — Progress Notes (Signed)
PHARMACY - PHYSICIAN COMMUNICATION CRITICAL VALUE ALERT - BLOOD CULTURE IDENTIFICATION (BCID)  Results for orders placed or performed during the hospital encounter of 08/07/16  Blood Culture ID Panel (Reflexed) (Collected: 08/07/2016  2:49 PM)  Result Value Ref Range   Enterococcus species NOT DETECTED NOT DETECTED   Listeria monocytogenes NOT DETECTED NOT DETECTED   Staphylococcus species NOT DETECTED NOT DETECTED   Staphylococcus aureus NOT DETECTED NOT DETECTED   Streptococcus species DETECTED (A) NOT DETECTED   Streptococcus agalactiae NOT DETECTED NOT DETECTED   Streptococcus pneumoniae NOT DETECTED NOT DETECTED   Streptococcus pyogenes DETECTED (A) NOT DETECTED   Acinetobacter baumannii NOT DETECTED NOT DETECTED   Enterobacteriaceae species NOT DETECTED NOT DETECTED   Enterobacter cloacae complex NOT DETECTED NOT DETECTED   Escherichia coli NOT DETECTED NOT DETECTED   Klebsiella oxytoca NOT DETECTED NOT DETECTED   Klebsiella pneumoniae NOT DETECTED NOT DETECTED   Proteus species NOT DETECTED NOT DETECTED   Serratia marcescens NOT DETECTED NOT DETECTED   Haemophilus influenzae NOT DETECTED NOT DETECTED   Neisseria meningitidis NOT DETECTED NOT DETECTED   Pseudomonas aeruginosa NOT DETECTED NOT DETECTED   Candida albicans NOT DETECTED NOT DETECTED   Candida glabrata NOT DETECTED NOT DETECTED   Candida krusei NOT DETECTED NOT DETECTED   Candida parapsilosis NOT DETECTED NOT DETECTED   Candida tropicalis NOT DETECTED NOT DETECTED    Name of physician (or Provider) Contacted: Konidena paged x [phone], call returned at 12:35.   Changes to prescribed antibiotics required: D/c amoxicillin, start penicillin G 4 million units IV Q4H  Diedra Sinor C 08/08/2016  12:39 PM

## 2016-08-08 NOTE — Progress Notes (Signed)
This nurse resumed care of patient from the nurse Koleen NimrodAdrian at 2pm. Patient has been alert and oriented. VSS. No c/o pain or distress. IV fluids and antibiotics infusing. Visitor at bedside currently. Continue to monitor.

## 2016-08-09 ENCOUNTER — Inpatient Hospital Stay (HOSPITAL_COMMUNITY)
Admit: 2016-08-09 | Discharge: 2016-08-09 | Disposition: A | Discharge status: Home / Self Care | Payer: BC Managed Care – PPO | Attending: Internal Medicine | Admitting: Internal Medicine

## 2016-08-09 DIAGNOSIS — I361 Nonrheumatic tricuspid (valve) insufficiency: Secondary | ICD-10-CM

## 2016-08-09 LAB — ECHOCARDIOGRAM COMPLETE
Height: 65 in
Weight: 3846.4 oz

## 2016-08-09 LAB — CBC
HEMATOCRIT: 31.7 % — AB (ref 35.0–47.0)
HEMOGLOBIN: 10.5 g/dL — AB (ref 12.0–16.0)
MCH: 28.1 pg (ref 26.0–34.0)
MCHC: 33.1 g/dL (ref 32.0–36.0)
MCV: 84.7 fL (ref 80.0–100.0)
Platelets: 247 10*3/uL (ref 150–440)
RBC: 3.74 MIL/uL — ABNORMAL LOW (ref 3.80–5.20)
RDW: 15.4 % — AB (ref 11.5–14.5)
WBC: 13 10*3/uL — AB (ref 3.6–11.0)

## 2016-08-09 LAB — BASIC METABOLIC PANEL
Anion gap: 6 (ref 5–15)
BUN: 7 mg/dL (ref 6–20)
CALCIUM: 8.8 mg/dL — AB (ref 8.9–10.3)
CHLORIDE: 109 mmol/L (ref 101–111)
CO2: 24 mmol/L (ref 22–32)
CREATININE: 0.77 mg/dL (ref 0.44–1.00)
GFR calc non Af Amer: >60 mL/min (ref >60)
Glucose, Bld: 148 mg/dL — ABNORMAL HIGH (ref 65–99)
Potassium: 4.1 mmol/L (ref 3.5–5.1)
SODIUM: 139 mmol/L (ref 135–145)

## 2016-08-09 LAB — C DIFFICILE QUICK SCREEN W PCR REFLEX
C DIFFICILE (CDIFF) INTERP: NOT DETECTED
C DIFFICILE (CDIFF) TOXIN: NEGATIVE
C Diff antigen: NEGATIVE

## 2016-08-09 LAB — URINE CULTURE

## 2016-08-09 LAB — HEMOGLOBIN A1C
Hgb A1c MFr Bld: 6.1 % — ABNORMAL HIGH (ref 4.8–5.6)
Mean Plasma Glucose: 128 mg/dL

## 2016-08-09 NOTE — Progress Notes (Signed)
Patient states she had two episodes of diarrhea this am, and two episodes last night, since she has started the antibiotics. MD paged, waiting on callback.

## 2016-08-09 NOTE — Progress Notes (Signed)
[facility] [place] at [facility]   PATIENT NAME: Diane Walsh    MR#:  [id number]  DATE OF BIRTH:  Oct 21, 1955  Blood Cultures positive for strep pyogenes. No fever. No cough.   CHIEF COMPLAINT:   Chief Complaint  Patient presents with  . Fever  . Weakness    REVIEW OF SYSTEMS:   ROS CONSTITUTIONAL:   noFever, has weakness.  EYES: No blurred or double vision.  Has Head ache. EARS, NOSE, AND THROAT: No tinnitus or ear pain.slight neck pain on left side.  RESPIRATORY: No cough, shortness of breath, wheezing or hemoptysis.  CARDIOVASCULAR: No chest pain, orthopnea, edema.  GASTROINTESTINAL:has  nausea,  No vomiting, diarrhea or abdominal pain.  GENITOURINARY: No dysuria, hematuria.  ENDOCRINE: No polyuria, nocturia,  HEMATOLOGY: No anemia, easy bruising or bleeding SKIN: No rash or lesion. MUSCULOSKELETAL: No joint pain or arthritis.   NEUROLOGIC: No tingling, numbness, weakness.  PSYCHIATRY: No anxiety or depression.   DRUG ALLERGIES:  No Known Allergies  VITALS:  Blood pressure (!) 124/58, pulse (!) 109, temperature 99.2 F (37.3 C), temperature source Oral, resp. rate 18, height 5\' 5"  (1.651 m), weight 109 kg (240 lb 6.4 oz), SpO2 92 %.  PHYSICAL EXAMINATION:  GENERAL:  61 y.o.-year-old patient lying in the bed with no acute distress.  EYES: Pupils equal, round, reactive to light  No scleral icterus. Extraocular muscles intact.  HEENT: Head atraumatic, normocephalic. Oropharynx and nasopharynx clear. Patient has maxillary sinus tenderness . NECK:  Supple, no jugular venous distention. No thyroid enlargement, slight tenderness in left sternocledomastoid area.small lymphnode  tender but not red or swollen..  LUNGS: Normal breath sounds bilaterally, no wheezing, rales,rhonchi or crepitation. No use of accessory muscles of respiration.  CARDIOVASCULAR: S1, S2 normal. No murmurs, rubs, or gallops.  ABDOMEN: Soft, nontender, nondistended. Bowel  sounds present. No organomegaly or mass.  EXTREMITIES: No pedal edema, cyanosis, or clubbing.  NEUROLOGIC: Cranial nerves II through XII are intact. Muscle strength 5/5 in all extremities. Sensation intact. Gait not checked.  PSYCHIATRIC: The patient is alert and oriented x 3.  SKIN: No obvious rash, lesion, or ulcer.    LABORATORY PANEL:   CBC  Recent Labs Lab 08/07/16 1449  WBC 13.2*  HGB 12.3  HCT 36.7  PLT 288   ------------------------------------------------------------------------------------------------------------------  Chemistries   Recent Labs Lab 08/07/16 1449  NA 136  K 4.3  CL 102  CO2 24  GLUCOSE 111*  BUN 10  CREATININE 0.86  CALCIUM 8.9  AST 23  ALT 15  ALKPHOS 114  BILITOT 1.0   ------------------------------------------------------------------------------------------------------------------  Cardiac Enzymes  Recent Labs Lab 08/07/16 1449  TROPONINI 0.04*   ------------------------------------------------------------------------------------------------------------------  RADIOLOGY:  Dg Chest 2 View  Result Date: 08/07/2016 CLINICAL DATA:  Weakness and dizziness for 1 week with fever EXAM: CHEST  2 VIEW COMPARISON:  02/18/2016, 01/24/2016, 08/08/2014 FINDINGS: No focal consolidation or pleural effusion. Stable right middle lobe opacity. Normal heart size. No pneumothorax. Surgical clips in the upper abdomen. IMPRESSION: No active cardiopulmonary disease. Electronically Signed   By: Jasmine PangKim  Fujinaga M.D.   On: 08/07/2016 15:21   Ct Angio Chest Pe W/cm &/or Wo Cm  Result Date: 08/07/2016 CLINICAL DATA:  Unexplained hypoxia. Weakness and dizziness with fever for the past 2 days. Cough. Congestion and sore throat since Sunday. EXAM: CT ANGIOGRAPHY CHEST WITH CONTRAST TECHNIQUE: Multidetector CT imaging of the chest was performed using the standard protocol during bolus administration of intravenous contrast. Multiplanar CT image reconstructions and  MIPs were obtained to evaluate the vascular anatomy. CONTRAST:  75 cc of Isovue 370 in COMPARISON:  Plain films 08/07/2016.  No prior CT. FINDINGS: Cardiovascular: The quality of this exam for evaluation of pulmonary embolism is moderate. The bolus is suboptimally timed, with contrast centered in the SVC. No pulmonary embolism to the large segmental level. Aortic atherosclerosis. No evidence of dissection. Mild cardiomegaly, without pericardial effusion. Mediastinum/Nodes: Multiple small low left jugular and supraclavicular nodes. Example images 1-5/ series 4. Possible edema in this area. No mediastinal or hilar adenopathy. Lungs/Pleura: No pleural fluid.  Clear lungs. Upper Abdomen: Cholecystectomy. Left hepatic lobe cyst. Normal imaged portions of the spleen, stomach, pancreas, adrenal glands, kidneys. Musculoskeletal: No acute osseous abnormality. Review of the MIP images confirms the above findings. IMPRESSION: 1. Moderate quality exam for pulmonary embolism. No large embolism identified. 2. Prominent nodes in the low left neck with possible edema in this region. Consider physical exam correlation to exclude cellulitis or other infectious/ inflammatory process. 3.  Aortic atherosclerosis. Electronically Signed   By: Jeronimo Greaves M.D.   On: 08/07/2016 17:04    EKG:   Orders placed or performed during the hospital encounter of 08/07/16  . EKG 12-Lead  . EKG 12-Lead  . ED EKG  . ED EKG    1.. Sepsis with strep pyogenes likely due to strep throat infection and possible lymphadenitis. Check echocardiogram to rule out vegetations, no fever. Continue penicillins 250 MG IV every 4 hours, discontinue telemetry, curbside consult with Dr. Sampson Goon, I will talk to him today, check electrolytes, CBC today.  #2 reactive airway disease: Continue inhaled steroids, albuterol, no wheezing or hypoxia today.  #3. hypothyroidism: Cultures and thyroid #4 .depression: Continue Zoloft   D/w RN    All the records  are reviewed and case discussed with Care Management/Social Workerr. Management plans discussed with the patient, family and they are in agreement.  CODE STATUS: full  TOTAL TIME TAKING CARE OF THIS PATIENT: .   POSSIBLE D/C IN 1-2 DAYS, DEPENDING ON CLINICAL CONDITION.   Katha Hamming M.D on 08/09/2016 at 8:26 AM  Between 7am to 6pm - Pager - [phone]  After 6pm go to [email or web address] - password [email or web address] [facility]  [facility]  Office  [phone]  CC: Primary care physician; Glori Luis, MD   Note: This dictation was prepared with Dragon dictation along with smaller phrase technology. Any transcriptional errors that result from this process are unintentional.

## 2016-08-10 ENCOUNTER — Telehealth: Payer: Self-pay | Admitting: Family Medicine

## 2016-08-10 LAB — CBC
HEMATOCRIT: 31.2 % — AB (ref 35.0–47.0)
Hemoglobin: 10.4 g/dL — ABNORMAL LOW (ref 12.0–16.0)
MCH: 28.6 pg (ref 26.0–34.0)
MCHC: 33.3 g/dL (ref 32.0–36.0)
MCV: 86 fL (ref 80.0–100.0)
PLATELETS: 244 10*3/uL (ref 150–440)
RBC: 3.63 MIL/uL — ABNORMAL LOW (ref 3.80–5.20)
RDW: 15.7 % — AB (ref 11.5–14.5)
WBC: 10.7 10*3/uL (ref 3.6–11.0)

## 2016-08-10 LAB — CULTURE, BLOOD (ROUTINE X 2)
Special Requests: ADEQUATE
Special Requests: ADEQUATE

## 2016-08-10 MED ORDER — VALACYCLOVIR HCL 1 G PO TABS: 2000.0000 mg | ORAL_TABLET | Freq: Two times a day (BID) | ORAL | 0 refills | Status: AC

## 2016-08-10 MED ORDER — AMPICILLIN 500 MG PO CAPS: 500.0000 mg | ORAL_CAPSULE | Freq: Four times a day (QID) | ORAL | 0 refills | Status: DC

## 2016-08-10 MED ORDER — ACYCLOVIR 5 % EX OINT: TOPICAL_OINTMENT | CUTANEOUS | 0 refills | Status: DC

## 2016-08-10 NOTE — Discharge Summary (Signed)
Diane Walsh, is a 61 y.o. female  DOB [birth year]  MRN [id number].  Admission date:  08/07/2016  Admitting Physician  Arnaldo Natal, MD  Discharge Date:  08/10/2016   Primary MD  Glori Luis, MD  Recommendations for primary care physician for things to follow:   Follow-up with PCP in 1 week Follow up with Dr. Sampson Goon in  One week  Admission Diagnosis  Sepsis, due to unspecified organism [facility]) [A41.9]   Discharge Diagnosis  Sepsis, due to unspecified organism [facility]) [A41.9]    Active Problems:   Fever   Bacteremia      Past Medical History:  Diagnosis Date  . Allergic rhinitis   . Chickenpox   . Depression   . GERD (gastroesophageal reflux disease)   . Stress incontinence   . Thyroid disease   . UTI (urinary tract infection)     Past Surgical History:  Procedure Laterality Date  . BREAST BIOPSY Right 11/28/2014   stereotactic biopsy - negative  . CHOLECYSTECTOMY    . HAND SURGERY Right 02/12/2015   Dr. Amanda Pea; [facility]  . TONSILLECTOMY         History of present illness and  Hospital Course:     Kindly see H&P for history of present illness and admission details, please review complete Labs, Consult reports and Test reports for all details in brief  HPI  from the history and physical done on the day of admission 61 year old female patient with history of GERD, depression, thyroid disease admitted for fever, generalized weakness, throat pain. Patient admitted for sepsis with fever, tachycardia. Started on by mouth Augmentin and admission. Patient received vancomycin in the emergency room.   Hospital Course   Sepsis with strep viridans bacteremia: Initially admitted to observation status for fever, tachycardia, received IV hydration, oral antibiotics with Augmentin,  antibiotics were changed to penicillin G IV because of strep viridans and blood cultures, patient did not have any further fevers, stayed hemodynamically stable with IV fluids, IV penicillins. Her throat pain also decreased and she is tolerating the diet now and has no throat pain now.  seen by Dr. Sampson Goon ID , he recommended 11 days of oral penicillins to finish the course for pharyngitis causing bacteremia. Discharge home with ampicillin 500 mg 4 times daily for 11 days. Patient has slight pain on the left side of neck but she said it's better. If patient continues to have left-sided neck pain needs a CAT scan of the neck. Evaluate for peritonsillar abscess. But because she is tolerating the diet without any fevers he felt comfortable discharging the patient, patient can have a CAT scan of the outpatient if patient symptoms get worse with respect to her pain  In Neck. discussed with patient's daughter also. They are in agreement with it.aslo discussed withDr. Sampson Goon also.   #2 .cold sore on both lips: Received Valtrex 2 g every 12 hours for 1 day, also written prescription for acyclovir to use as needed  For later use as patient wanted to get a prescription.  Discharge Condition: stable   Follow UP  Follow-up Information    Mick Sell, MD. Schedule an appointment as soon as possible for a visit in 1 week(s).   Specialty:  Infectious Diseases Contact information: [address] [place] [address] [phone]        Glori Luis, MD Follow up in 1 week(s).   Specialty:  Family Medicine Contact information: [phone]  [place] [address] [phone]             Discharge Instructions  and  Discharge Medications      Allergies as of 08/10/2016   No Known Allergies     Medication List    STOP taking these medications   docusate sodium 100 MG capsule Commonly known as:  COLACE   oseltamivir 75 MG capsule Commonly known  as:  TAMIFLU   pantoprazole 40 MG tablet Commonly known as:  PROTONIX   predniSONE 20 MG tablet Commonly known as:  DELTASONE     TAKE these medications   acyclovir ointment 5 % Commonly known as:  ZOVIRAX Apply thin layer to affected area   albuterol 108 (90 Base) MCG/ACT inhaler Commonly known as:  PROVENTIL HFA;VENTOLIN HFA Inhale 2 puffs into the lungs every 4 (four) hours as needed for wheezing or shortness of breath.   ampicillin 500 MG capsule Commonly known as:  PRINCIPEN Take 1 capsule (500 mg total) by mouth 4 (four) times daily.   azelastine 0.05 % ophthalmic solution Commonly known as:  OPTIVAR Place 1 drop into both eyes 2 (two) times daily as needed.   fluticasone furoate-vilanterol 100-25 MCG/INH Aepb Commonly known as:  BREO ELLIPTA Inhale 1 puff into the lungs daily.   levothyroxine 75 MCG tablet Commonly known as:  SYNTHROID, LEVOTHROID TAKE 1 TABLET BY MOUTH EVERY DAY   montelukast 10 MG tablet Commonly known as:  SINGULAIR TAKE 1 TABLET BY MOUTH EVERY DAY   omeprazole 20 MG capsule Commonly known as:  PRILOSEC TAKE 1 CAPSULE (20 MG TOTAL) BY MOUTH 2 (TWO) TIMES DAILY BEFORE A MEAL.   sertraline 100 MG tablet Commonly known as:  ZOLOFT TAKE 1 TABLET BY MOUTH EVERY EVENING   valACYclovir 1000 MG tablet Commonly known as:  VALTREX Take 2 tablets (2,000 mg total) by mouth 2 (two) times daily.         Diet and Activity recommendation: See Discharge Instructions above   Consults obtained - ID  Major procedures and Radiology Reports - PLEASE review detailed and final reports for all details, in brief -     Dg Chest 2 View  Result Date: 08/07/2016 CLINICAL DATA:  Weakness and dizziness for 1 week with fever EXAM: CHEST  2 VIEW COMPARISON:  02/18/2016, 01/24/2016, 08/08/2014 FINDINGS: No focal consolidation or pleural effusion. Stable right middle lobe opacity. Normal heart size. No pneumothorax. Surgical clips in the upper abdomen.  IMPRESSION: No active cardiopulmonary disease. Electronically Signed   By: Jasmine Pang M.D.   On: 08/07/2016 15:21   Ct Angio Chest Pe W/cm &/or Wo Cm  Result Date: 08/07/2016 CLINICAL DATA:  Unexplained hypoxia. Weakness and dizziness with fever for the past 2 days. Cough. Congestion and sore throat since Sunday. EXAM: CT ANGIOGRAPHY CHEST WITH CONTRAST TECHNIQUE: Multidetector CT imaging of the chest was performed using the standard protocol during bolus administration of intravenous contrast. Multiplanar CT image reconstructions and MIPs were obtained to evaluate the vascular anatomy. CONTRAST:  75 cc of Isovue 370 in COMPARISON:  Plain films 08/07/2016.  No prior CT. FINDINGS: Cardiovascular: The quality of this exam for evaluation of pulmonary embolism is moderate. The bolus is suboptimally timed, with contrast centered in the SVC. No pulmonary embolism to the large segmental level. Aortic atherosclerosis. No evidence of dissection. Mild cardiomegaly, without pericardial effusion. Mediastinum/Nodes: Multiple small low left jugular and supraclavicular nodes. Example images 1-5/ series 4. Possible edema in this area. No mediastinal or hilar adenopathy.  Lungs/Pleura: No pleural fluid.  Clear lungs. Upper Abdomen: Cholecystectomy. Left hepatic lobe cyst. Normal imaged portions of the spleen, stomach, pancreas, adrenal glands, kidneys. Musculoskeletal: No acute osseous abnormality. Review of the MIP images confirms the above findings. IMPRESSION: 1. Moderate quality exam for pulmonary embolism. No large embolism identified. 2. Prominent nodes in the low left neck with possible edema in this region. Consider physical exam correlation to exclude cellulitis or other infectious/ inflammatory process. 3.  Aortic atherosclerosis. Electronically Signed   By: Jeronimo GreavesKyle  Talbot M.D.   On: 08/07/2016 17:04    Micro Results    Recent Results (from the past 240 hour(s))  Culture, blood (Routine x 2)     Status: Abnormal    Collection Time: 08/07/16  2:49 PM  Result Value Ref Range Status   Specimen Description BLOOD RIGHT ASSIST CONTROL  Final   Special Requests   Final    BOTTLES DRAWN AEROBIC AND ANAEROBIC Blood Culture adequate volume   Culture  Setup Time   Final    GRAM POSITIVE COCCI IN BOTH AEROBIC AND ANAEROBIC BOTTLES CRITICAL RESULT CALLED TO, READ BACK BY AND VERIFIED WITH: Jodie Barefoot @ [phone] [facility]08/16 [facility]    Culture STREPTOCOCCUS PYOGENES (A)  Final   Report Status 08/10/2016 FINAL  Final   Organism ID, Bacteria STREPTOCOCCUS PYOGENES  Final      Susceptibility   Streptococcus pyogenes - MIC*    ERYTHROMYCIN <=0.12 SENSITIVE Sensitive     LEVOFLOXACIN 0.5 SENSITIVE Sensitive     VANCOMYCIN 0.25 SENSITIVE Sensitive     * STREPTOCOCCUS PYOGENES  Blood Culture ID Panel (Reflexed)     Status: Abnormal   Collection Time: 08/07/16  2:49 PM  Result Value Ref Range Status   Enterococcus species NOT DETECTED NOT DETECTED Final   Listeria monocytogenes NOT DETECTED NOT DETECTED Final   Staphylococcus species NOT DETECTED NOT DETECTED Final   Staphylococcus aureus NOT DETECTED NOT DETECTED Final   Streptococcus species DETECTED (A) NOT DETECTED Final    Comment: CRITICAL RESULT CALLED TO, READ BACK BY AND VERIFIED WITH: Jodie Barefoot @ [phone] 08/08/16 by [facility]    Streptococcus agalactiae NOT DETECTED NOT DETECTED Final   Streptococcus pneumoniae NOT DETECTED NOT DETECTED Final   Streptococcus pyogenes DETECTED (A) NOT DETECTED Final    Comment: CRITICAL RESULT CALLED TO, READ BACK BY AND VERIFIED WITH: Jodie Barefoot @ [phone] 08/08/16 by [facility]    Acinetobacter baumannii NOT DETECTED NOT DETECTED Final   Enterobacteriaceae species NOT DETECTED NOT DETECTED Final   Enterobacter cloacae complex NOT DETECTED NOT DETECTED Final   Escherichia coli NOT DETECTED NOT DETECTED Final   Klebsiella oxytoca NOT DETECTED NOT DETECTED Final   Klebsiella pneumoniae NOT DETECTED NOT DETECTED Final   Proteus  species NOT DETECTED NOT DETECTED Final   Serratia marcescens NOT DETECTED NOT DETECTED Final   Haemophilus influenzae NOT DETECTED NOT DETECTED Final   Neisseria meningitidis NOT DETECTED NOT DETECTED Final   Pseudomonas aeruginosa NOT DETECTED NOT DETECTED Final   Candida albicans NOT DETECTED NOT DETECTED Final   Candida glabrata NOT DETECTED NOT DETECTED Final   Candida krusei NOT DETECTED NOT DETECTED Final   Candida parapsilosis NOT DETECTED NOT DETECTED Final   Candida tropicalis NOT DETECTED NOT DETECTED Final  Culture, blood (Routine x 2)     Status: Abnormal   Collection Time: 08/07/16  3:56 PM  Result Value Ref Range Status   Specimen Description BLOOD BLOOD LEFT HAND  Final   Special Requests  Final    BOTTLES DRAWN AEROBIC AND ANAEROBIC Blood Culture adequate volume   Culture  Setup Time   Final    GRAM POSITIVE COCCI IN BOTH AEROBIC AND ANAEROBIC BOTTLES CRITICAL VALUE NOTED.  VALUE IS CONSISTENT WITH PREVIOUSLY REPORTED AND CALLED VALUE.    Culture (A)  Final    STREPTOCOCCUS PYOGENES SUSCEPTIBILITIES PERFORMED ON PREVIOUS CULTURE WITHIN THE LAST 5 DAYS. Performed at [facility], [address] [place], [place] [address]    Report Status 08/10/2016 FINAL  Final  Urine culture     Status: Abnormal   Collection Time: 08/07/16  5:29 PM  Result Value Ref Range Status   Specimen Description URINE, RANDOM  Final   Special Requests NONE  Final   Culture MULTIPLE SPECIES PRESENT, SUGGEST RECOLLECTION (A)  Final   Report Status 08/09/2016 FINAL  Final  C difficile quick scan w PCR reflex     Status: None   Collection Time: 08/09/16 11:40 AM  Result Value Ref Range Status   C Diff antigen NEGATIVE NEGATIVE Final   C Diff toxin NEGATIVE NEGATIVE Final   C Diff interpretation No C. difficile detected.  Final       Today   Subjective:   Diane Walsh today has No headache, no trouble swallowing, eager to go home.  Objective:   Blood pressure (!) 142/81,  pulse 72, temperature 97.7 F (36.5 C), temperature source Oral, resp. rate 16, height 5\' 5"  (1.651 m), weight 109 kg (240 lb 6.4 oz), SpO2 98 %.   Intake/Output Summary (Last 24 hours) at 08/10/16 0925 Last data filed at 08/09/16 1534  Gross per 24 hour  Intake          1044.58 ml  Output                0 ml  Net          1044.58 ml    Exam Awake Alert, Oriented x 3, No new F.N deficits, Normal affect [place].AT,PERRAL Supple Neck,No JVD, No cervical lymphadenopathy appriciated.  Symmetrical Chest wall movement, Good air movement bilaterally, CTAB RRR,No Gallops,Rubs or new Murmurs, No Parasternal Heave +ve B.Sounds, Abd Soft, Non tender, No organomegaly appriciated, No rebound -guarding or rigidity. No Cyanosis, Clubbing or edema, No new Rash or bruise  Data Review   CBC w Diff: Lab Results  Component Value Date   WBC 13.0 (H) 08/09/2016   HGB 10.5 (L) 08/09/2016   HGB 11.8 08/28/2015   HCT 31.7 (L) 08/09/2016   HCT 37.1 08/28/2015   PLT 247 08/09/2016   PLT 320 08/28/2015   LYMPHOPCT 6 08/07/2016   MONOPCT 6 08/07/2016   EOSPCT 2 08/07/2016   BASOPCT 0 08/07/2016    CMP: Lab Results  Component Value Date   NA 139 08/09/2016   NA 141 08/28/2015   K 4.1 08/09/2016   CL 109 08/09/2016   CO2 24 08/09/2016   BUN 7 08/09/2016   BUN 13 08/28/2015   CREATININE 0.77 08/09/2016   PROT 8.0 08/07/2016   PROT 7.0 08/28/2015   ALBUMIN 4.1 08/07/2016   ALBUMIN 4.1 08/28/2015   BILITOT 1.0 08/07/2016   BILITOT 0.5 08/28/2015   ALKPHOS 114 08/07/2016   AST 23 08/07/2016   ALT 15 08/07/2016  .   Total Time in preparing paper work, data evaluation and todays exam - 35 minutes  Vivyan Biggers M.D on 08/10/2016 at 9:25 AM    Note: This dictation was prepared with Dragon dictation along  with smaller phrase technology. Any transcriptional errors that result from this process are unintentional.

## 2016-08-10 NOTE — Telephone Encounter (Signed)
[facility]. Pt was in for sepsis, she needed a one week follow up. Pt scheduled for 6/18 @ 11:30.

## 2016-08-10 NOTE — Consult Note (Signed)
[facility] Infectious Disease     Reason for Consult:  Strep A bacteremia   Referring Physician: Governor Specking Date of Admission:  08/07/2016   Active Problems:   Fever   Bacteremia   HPI: Diane Walsh is a 61 y.o. female admitted 5 days ST fever, weakness.. 2 grandchildren just dx with Strep. She was also having nasal discharge and some neck pain. On admit wbc was 13, temp up t0 101.6. Started ctx, azithro and when [employer] + changed to IV pcn. She has defervesced, wbc down to 10 no further ST, able to eat well.   Past Medical History:  Diagnosis Date  . Allergic rhinitis   . Chickenpox   . Depression   . GERD (gastroesophageal reflux disease)   . Stress incontinence   . Thyroid disease   . UTI (urinary tract infection)    Past Surgical History:  Procedure Laterality Date  . BREAST BIOPSY Right 11/28/2014   stereotactic biopsy - negative  . CHOLECYSTECTOMY    . HAND SURGERY Right 02/12/2015   Dr. Amedeo Plenty; [facility]  . TONSILLECTOMY     Social History  Substance Use Topics  . Smoking status: Never Smoker  . Smokeless tobacco: Never Used  . Alcohol use Yes     Comment: Very Rarely   Family History  Problem Relation Age of Onset  . Hypothyroidism Mother   . Dementia Mother   . Hypertension Father   . Diabetes Father   . Hypertension Sister   . Breast cancer Cousin   . Breast cancer Cousin     Allergies: No Known Allergies  Current antibiotics: Antibiotics Given (last 72 hours)    Date/Time Action Medication Dose Rate   08/07/16 1603 New Bag/Given   azithromycin (ZITHROMAX) 500 mg in dextrose 5 % 250 mL IVPB 500 mg 250 mL/hr   08/07/16 1640 New Bag/Given   cefTRIAXone (ROCEPHIN) 1 g in dextrose 5 % 50 mL IVPB 1 g 100 mL/hr   08/08/16 0042 Given   amoxicillin-clavulanate (AUGMENTIN) 875-125 MG per tablet 1 tablet 1 tablet    08/08/16 0826 Given   amoxicillin-clavulanate (AUGMENTIN) 875-125 MG per tablet 1 tablet 1 tablet    08/08/16 1515 New  Bag/Given   penicillin G potassium 4 Million Units in dextrose 5 % 250 mL IVPB 4 Million Units 250 mL/hr   08/08/16 2142 New Bag/Given   penicillin G potassium 4 Million Units in dextrose 5 % 250 mL IVPB 4 Million Units 250 mL/hr   08/09/16 0147 New Bag/Given   penicillin G potassium 4 Million Units in dextrose 5 % 250 mL IVPB 4 Million Units 250 mL/hr   08/09/16 0625 New Bag/Given   penicillin G potassium 4 Million Units in dextrose 5 % 250 mL IVPB 4 Million Units 250 mL/hr   08/09/16 0823 New Bag/Given   penicillin G potassium 4 Million Units in dextrose 5 % 250 mL IVPB 4 Million Units 250 mL/hr   08/09/16 1312 New Bag/Given   penicillin G potassium 4 Million Units in dextrose 5 % 250 mL IVPB 4 Million Units 250 mL/hr   08/09/16 1615 New Bag/Given   penicillin G potassium 4 Million Units in dextrose 5 % 250 mL IVPB 4 Million Units 250 mL/hr   08/09/16 2045 New Bag/Given   penicillin G potassium 4 Million Units in dextrose 5 % 250 mL IVPB 4 Million Units 250 mL/hr   08/10/16 0001 New Bag/Given   penicillin G potassium 4 Million Units in dextrose  5 % 250 mL IVPB 4 Million Units 250 mL/hr   08/10/16 0516 New Bag/Given   penicillin G potassium 4 Million Units in dextrose 5 % 250 mL IVPB 4 Million Units 250 mL/hr   08/10/16 0737 New Bag/Given   penicillin G potassium 4 Million Units in dextrose 5 % 250 mL IVPB 4 Million Units 250 mL/hr      MEDICATIONS: . enoxaparin (LOVENOX) injection  40 mg Subcutaneous Q24H  . fluticasone furoate-vilanterol  1 puff Inhalation Daily  . ketotifen  1 drop Both Eyes BID  . levothyroxine  75 mcg Oral QAC breakfast  . montelukast  10 mg Oral Daily  . pantoprazole  40 mg Oral BID AC  . predniSONE  20 mg Oral Q breakfast  . sertraline  100 mg Oral QPM    Review of Systems - 11 systems reviewed and negative per HPI   OBJECTIVE: Temp:  [97.7 F (36.5 C)-98.1 F (36.7 C)] 97.7 F (36.5 C) (06/11 0735) Pulse Rate:  [72-80] 72 (06/11 0735) Resp:   [16-20] 16 (06/11 0735) BP: (140-142)/(80-81) 142/81 (06/11 0735) SpO2:  [97 %-98 %] 98 % (06/11 0735) Physical Exam  Constitutional:  oriented to person, place, and time. appears well-developed and well-nourished. No distress.  HENT: [place]/AT, PERRLA, no scleral icterus Mouth/Throat: Oropharynx is clear and moist. No oropharyngeal exudate.  Cardiovascular: Normal rate, regular rhythm and normal heart sounds. Exam reveals no gallop and no friction rub.  No murmur heard.  Pulmonary/Chest: Effort normal and breath sounds normal. No respiratory distress.  has no wheezes.  Neck = supple, no nuchal rigidity Abdominal: Soft. Bowel sounds are normal.  exhibits no distension. There is no tenderness.  Lymphadenopathy: mildly tender L ant neck LAN Neurological: alert and oriented to person, place, and time.  Skin: Skin is warm and dry. No rash noted. No erythema.  Psychiatric: a normal mood and affect.  behavior is normal.    LABS: Results for orders placed or performed during the hospital encounter of 08/07/16 (from the past 48 hour(s))  C difficile quick scan w PCR reflex     Status: None   Collection Time: 08/09/16 11:40 AM  Result Value Ref Range   C Diff antigen NEGATIVE NEGATIVE   C Diff toxin NEGATIVE NEGATIVE   C Diff interpretation No C. difficile detected.   Culture, group A strep     Status: None (Preliminary result)   Collection Time: 08/09/16  1:11 PM  Result Value Ref Range   Specimen Description THROAT    Special Requests NONE    Culture      CULTURE REINCUBATED FOR BETTER GROWTH Performed at [facility], [place] [address] [place],  [address]    Report Status PENDING   CBC     Status: Abnormal   Collection Time: 08/09/16  8:23 PM  Result Value Ref Range   WBC 13.0 (H) 3.6 - 11.0 K/uL   RBC 3.74 (L) 3.80 - 5.20 MIL/uL   Hemoglobin 10.5 (L) 12.0 - 16.0 g/dL   HCT 31.7 (L) 35.0 - 47.0 %   MCV 84.7 80.0 - 100.0 fL   MCH 28.1 26.0 - 34.0 pg   MCHC 33.1 32.0 -  36.0 g/dL   RDW 15.4 (H) 11.5 - 14.5 %   Platelets 247 150 - 440 K/uL  Basic metabolic panel     Status: Abnormal   Collection Time: 08/09/16  8:23 PM  Result Value Ref Range   Sodium 139 135 - 145  mmol/L   Potassium 4.1 3.5 - 5.1 mmol/L   Chloride 109 101 - 111 mmol/L   CO2 24 22 - 32 mmol/L   Glucose, Bld 148 (H) 65 - 99 mg/dL   BUN 7 6 - 20 mg/dL   Creatinine, Ser 0.77 0.44 - 1.00 mg/dL   Calcium 8.8 (L) 8.9 - 10.3 mg/dL   GFR calc non Af Amer >60 >60 mL/min   GFR calc Af Amer >60 >60 mL/min    Comment: (NOTE) The eGFR has been calculated using the CKD EPI equation. This calculation has not been validated in all clinical situations. eGFR's persistently <60 mL/min signify possible Chronic Kidney Disease.    Anion gap 6 5 - 15  CBC     Status: Abnormal   Collection Time: 08/10/16  8:58 AM  Result Value Ref Range   WBC 10.7 3.6 - 11.0 K/uL   RBC 3.63 (L) 3.80 - 5.20 MIL/uL   Hemoglobin 10.4 (L) 12.0 - 16.0 g/dL   HCT 31.2 (L) 35.0 - 47.0 %   MCV 86.0 80.0 - 100.0 fL   MCH 28.6 26.0 - 34.0 pg   MCHC 33.3 32.0 - 36.0 g/dL   RDW 15.7 (H) 11.5 - 14.5 %   Platelets 244 150 - 440 K/uL   No components found for: ESR, C REACTIVE PROTEIN MICRO: Recent Results (from the past 720 hour(s))  Culture, blood (Routine x 2)     Status: Abnormal   Collection Time: 08/07/16  2:49 PM  Result Value Ref Range Status   Specimen Description BLOOD RIGHT ASSIST CONTROL  Final   Special Requests   Final    BOTTLES DRAWN AEROBIC AND ANAEROBIC Blood Culture adequate volume   Culture  Setup Time   Final    GRAM POSITIVE COCCI IN BOTH AEROBIC AND ANAEROBIC BOTTLES CRITICAL RESULT CALLED TO, READ BACK BY AND VERIFIED WITH: Jodie Barefoot @ [phone] 08/08/16 by [facility]    Culture STREPTOCOCCUS PYOGENES (A)  Final   Report Status 08/10/2016 FINAL  Final   Organism ID, Bacteria STREPTOCOCCUS PYOGENES  Final      Susceptibility   Streptococcus pyogenes - MIC*    ERYTHROMYCIN <=0.12 SENSITIVE Sensitive      LEVOFLOXACIN 0.5 SENSITIVE Sensitive     VANCOMYCIN 0.25 SENSITIVE Sensitive     * STREPTOCOCCUS PYOGENES  Blood Culture ID Panel (Reflexed)     Status: Abnormal   Collection Time: 08/07/16  2:49 PM  Result Value Ref Range Status   Enterococcus species NOT DETECTED NOT DETECTED Final   Listeria monocytogenes NOT DETECTED NOT DETECTED Final   Staphylococcus species NOT DETECTED NOT DETECTED Final   Staphylococcus aureus NOT DETECTED NOT DETECTED Final   Streptococcus species DETECTED (A) NOT DETECTED Final    Comment: CRITICAL RESULT CALLED TO, READ BACK BY AND VERIFIED WITH: Jodie Barefoot @ [phone] 08/08/16 by [place]    Streptococcus agalactiae NOT DETECTED NOT DETECTED Final   Streptococcus pneumoniae NOT DETECTED NOT DETECTED Final   Streptococcus pyogenes DETECTED (A) NOT DETECTED Final    Comment: CRITICAL RESULT CALLED TO, READ BACK BY AND VERIFIED WITH: Jodie Barefoot @ [phone] 08/08/16 by [facility]    Acinetobacter baumannii NOT DETECTED NOT DETECTED Final   Enterobacteriaceae species NOT DETECTED NOT DETECTED Final   Enterobacter cloacae complex NOT DETECTED NOT DETECTED Final   Escherichia coli NOT DETECTED NOT DETECTED Final   Klebsiella oxytoca NOT DETECTED NOT DETECTED Final   Klebsiella pneumoniae NOT DETECTED NOT DETECTED Final   Proteus  species NOT DETECTED NOT DETECTED Final   Serratia marcescens NOT DETECTED NOT DETECTED Final   Haemophilus influenzae NOT DETECTED NOT DETECTED Final   Neisseria meningitidis NOT DETECTED NOT DETECTED Final   Pseudomonas aeruginosa NOT DETECTED NOT DETECTED Final   Candida albicans NOT DETECTED NOT DETECTED Final   Candida glabrata NOT DETECTED NOT DETECTED Final   Candida krusei NOT DETECTED NOT DETECTED Final   Candida parapsilosis NOT DETECTED NOT DETECTED Final   Candida tropicalis NOT DETECTED NOT DETECTED Final  Culture, blood (Routine x 2)     Status: Abnormal   Collection Time: 08/07/16  3:56 PM  Result Value Ref Range Status   Specimen  Description BLOOD BLOOD LEFT HAND  Final   Special Requests   Final    BOTTLES DRAWN AEROBIC AND ANAEROBIC Blood Culture adequate volume   Culture  Setup Time   Final    GRAM POSITIVE COCCI IN BOTH AEROBIC AND ANAEROBIC BOTTLES CRITICAL VALUE NOTED.  VALUE IS CONSISTENT WITH PREVIOUSLY REPORTED AND CALLED VALUE.    Culture (A)  Final    STREPTOCOCCUS PYOGENES SUSCEPTIBILITIES PERFORMED ON PREVIOUS CULTURE WITHIN THE LAST 5 DAYS. Performed at [facility], [place] [address], [place], [place] [address]    Report Status 08/10/2016 FINAL  Final  Urine culture     Status: Abnormal   Collection Time: 08/07/16  5:29 PM  Result Value Ref Range Status   Specimen Description URINE, RANDOM  Final   Special Requests NONE  Final   Culture MULTIPLE SPECIES PRESENT, SUGGEST RECOLLECTION (A)  Final   Report Status 08/09/2016 FINAL  Final  C difficile quick scan w PCR reflex     Status: None   Collection Time: 08/09/16 11:40 AM  Result Value Ref Range Status   C Diff antigen NEGATIVE NEGATIVE Final   C Diff toxin NEGATIVE NEGATIVE Final   C Diff interpretation No C. difficile detected.  Final  Culture, group A strep     Status: None (Preliminary result)   Collection Time: 08/09/16  1:11 PM  Result Value Ref Range Status   Specimen Description THROAT  Final   Special Requests NONE  Final   Culture   Final    CULTURE REINCUBATED FOR BETTER GROWTH Performed at [facility], [place] [address] [place], [place] [address]    Report Status PENDING  Incomplete    IMAGING: Dg Chest 2 View  Result Date: 08/07/2016 CLINICAL DATA:  Weakness and dizziness for 1 week with fever EXAM: CHEST  2 VIEW COMPARISON:  02/18/2016, 01/24/2016, 08/08/2014 FINDINGS: No focal consolidation or pleural effusion. Stable right middle lobe opacity. Normal heart size. No pneumothorax. Surgical clips in the upper abdomen. IMPRESSION: No active cardiopulmonary disease. Electronically Signed   By: Donavan Foil M.D.   On:  08/07/2016 15:21   Ct Angio Chest Pe W/cm &/or Wo Cm  Result Date: 08/07/2016 CLINICAL DATA:  Unexplained hypoxia. Weakness and dizziness with fever for the past 2 days. Cough. Congestion and sore throat since Sunday. EXAM: CT ANGIOGRAPHY CHEST WITH CONTRAST TECHNIQUE: Multidetector CT imaging of the chest was performed using the standard protocol during bolus administration of intravenous contrast. Multiplanar CT image reconstructions and MIPs were obtained to evaluate the vascular anatomy. CONTRAST:  75 cc of Isovue 370 in COMPARISON:  Plain films 08/07/2016.  No prior CT. FINDINGS: Cardiovascular: The quality of this exam for evaluation of pulmonary embolism is moderate. The bolus is suboptimally timed, with contrast centered in the SVC.  No pulmonary embolism to the large segmental level. Aortic atherosclerosis. No evidence of dissection. Mild cardiomegaly, without pericardial effusion. Mediastinum/Nodes: Multiple small low left jugular and supraclavicular nodes. Example images 1-5/ series 4. Possible edema in this area. No mediastinal or hilar adenopathy. Lungs/Pleura: No pleural fluid.  Clear lungs. Upper Abdomen: Cholecystectomy. Left hepatic lobe cyst. Normal imaged portions of the spleen, stomach, pancreas, adrenal glands, kidneys. Musculoskeletal: No acute osseous abnormality. Review of the MIP images confirms the above findings. IMPRESSION: 1. Moderate quality exam for pulmonary embolism. No large embolism identified. 2. Prominent nodes in the low left neck with possible edema in this region. Consider physical exam correlation to exclude cellulitis or other infectious/ inflammatory process. 3.  Aortic atherosclerosis. Electronically Signed   By: Abigail Miyamoto M.D.   On: 08/07/2016 17:04    Assessment:   SHAMA MONFILS is a 61 y.o. female with Grp A strep bacteremia in setting of pharyngitis.  Clinically much improved and wbc nml. CT chest did show some L neck LN and some edema that was done 3 days  ago and clinically better. Echo negative . No evidence abscess on exam. Discussed treatment with oral pcn and that if she worsens would need CT neck and ENT evaluation  Recommendations Would dc on oral penicillin 500 mg QID for 10 more days. FU with me to ensure improvement prior to stopping abx. If worsens will CT neck.  Thank you very much for allowing me to participate in the care of this patient. Please call with questions.   Cheral Marker. Ola Spurr, MD

## 2016-08-10 NOTE — Plan of Care (Signed)
Problem: Bowel/Gastric: Goal: Will not experience complications related to bowel motility Outcome: Completed/Met Date Met: 08/10/16 Pt has met all goals for discharge.

## 2016-08-10 NOTE — Progress Notes (Signed)
     Diane MoraleKathy Walsh was admitted to the Hospital on 08/07/2016 and Discharged  08/10/2016 and should be excused from work/school   For 6 days starting 08/07/2016 , may return to work/school without any restrictions.    Diane HammingKONIDENA,Diane Walsh M.D on 08/10/2016,at 9:23 AM

## 2016-08-10 NOTE — Progress Notes (Signed)
Shift assessment completed.Pt's daughter at bedside, pt stating she feels much better. Dr. Luberta MutterKonidena in on rounds directly after assessment completed. Stat lab to be drawn and then pt to discharge. Pt stated that pain to her knees has improved since arrival, and pt has dry cough that daughter describes as chronic. Pt afebrile and in agreement with discharge. Pt has begun to develop "fever blisters" to her upper lip and requested med for this on d/c and work note as well. This Clinical research associatewriter dc'd pt's piv with catheter intact. Pt received d/c instructions with family present in the room, all verbalized understanding of the information, pt signed and received copy. Pt escorted to front entrance of facility at 1115 via wc to waiting family vehicle.

## 2016-08-11 NOTE — Telephone Encounter (Signed)
Transition Care Management Follow-up Telephone Call  How have you been since you were released from the hospital? Patient staed she has felt pretty good since discharge but still congested some.   Do you understand why you were in the hospital? Yes   Do you understand the discharge instrcutions? yes  Items Reviewed:  Medications reviewed: Yes  Allergies reviewed: Yes  Dietary changes reviewed: Yes heart healthy during hospital stay but not at discharge.  Referrals reviewed: yes, Dr. Sampson GoonFitzgerald on 08/19/16   Functional Questionnaire:   Activities of Daily Living (ADLs):   She states they are independent in the following: In all ADLs  States they require assistance with the following: No assist needed.   Any transportation issues/concerns?: No  Any patient concerns?No   Confirmed importance and date/time of follow-up visits scheduled: Yes   Confirmed with patient if condition begins to worsen call PCP or go to the ER.  Patient was given the Call-a-Nurse line [phone]: Yes

## 2016-08-11 NOTE — Telephone Encounter (Signed)
Patient is wonder ing if there any way that she could get a temporary handi-cap parking plaquerd until she is better from her hospital stay.

## 2016-08-11 NOTE — Telephone Encounter (Signed)
I think it would be reasonable. We can discuss at her follow-up.

## 2016-08-12 LAB — CULTURE, GROUP A STREP (THRC)

## 2016-08-12 NOTE — Telephone Encounter (Signed)
Patient notified and voiced understanding.

## 2016-08-13 ENCOUNTER — Other Ambulatory Visit: Payer: Self-pay | Admitting: *Deleted

## 2016-08-13 NOTE — Telephone Encounter (Signed)
Last Ov 05/21/16 last filled 08/10/16

## 2016-08-13 NOTE — Telephone Encounter (Signed)
Patient requested to have acyclovir refilled , pt lost her new Rx in her home  Pharmacy [facility] [address]  Pt contact [phone]

## 2016-08-14 MED ORDER — ACYCLOVIR 5 % EX OINT: TOPICAL_OINTMENT | CUTANEOUS | 0 refills | Status: DC

## 2016-08-17 ENCOUNTER — Encounter: Payer: Self-pay | Admitting: Family Medicine

## 2016-08-17 ENCOUNTER — Ambulatory Visit (INDEPENDENT_AMBULATORY_CARE_PROVIDER_SITE_OTHER): Payer: BC Managed Care – PPO | Admitting: Family Medicine

## 2016-08-17 DIAGNOSIS — F329 Major depressive disorder, single episode, unspecified: Secondary | ICD-10-CM | POA: Diagnosis not present

## 2016-08-17 DIAGNOSIS — F419 Anxiety disorder, unspecified: Secondary | ICD-10-CM | POA: Diagnosis not present

## 2016-08-17 DIAGNOSIS — A409 Streptococcal sepsis, unspecified: Secondary | ICD-10-CM

## 2016-08-17 DIAGNOSIS — B001 Herpesviral vesicular dermatitis: Secondary | ICD-10-CM | POA: Insufficient documentation

## 2016-08-17 DIAGNOSIS — F32A Depression, unspecified: Secondary | ICD-10-CM

## 2016-08-17 MED ORDER — SERTRALINE HCL 100 MG PO TABS: 150.0000 mg | ORAL_TABLET | Freq: Every evening | ORAL | 1 refills | Status: DC

## 2016-08-17 NOTE — Assessment & Plan Note (Signed)
Patient recently hospitalized for sepsis related to strep pyogenes infection. She has been doing well since discharge. Continuing to take ampicillin. Benign exam today. Discharge summary reviewed. Medications reviewed with patient. She'll continue to monitor. She has follow-up set up with infectious disease later this week for recheck as well. Given return precautions.

## 2016-08-17 NOTE — Assessment & Plan Note (Signed)
Improving. Encouraged over-the-counter medications and encouraged her to look for her topical prescription medication. If they are unable to find it we could resubmit it in a month or so to see if insurance will cover it.

## 2016-08-17 NOTE — Assessment & Plan Note (Signed)
Sleep issues seem to be related to anxiety and depression and poor sleep hygiene. Discussed sleep hygiene. We will increase her zoloft to 150 for depression and anxiety. She'll follow-up in 6 weeks.

## 2016-08-17 NOTE — Patient Instructions (Signed)
Nice to see you. I am glad you are feeling better. Please try not watching TV the hour prior to bed and not drinking any caffeinated beverages in the afternoon. We will also try increasing your Zoloft to see if that will help with your sleep given your depression and anxiety. Please monitor your throat and monitor for fever. If you have recurrence of your prior symptoms please be reevaluated.

## 2016-08-17 NOTE — Progress Notes (Signed)
Diane AlarEric Diane Dam, MD Phone: [phone]  Diane MullKathy M Walsh is a 61 y.o. female who presents today for hospital follow-up.  Patient was admitted to the hospital from 08/07/16-08/10/16 for sepsis related to strep bacteremia. Felt to be related to possible strep throat though her strep test was negative. She was treated with IV fluids and IV antibiotics. She was discharged on oral ampicillin. She has been doing well since discharge. She's not had any recurrent fever. Notes her throat feels fine. She does have some weakness and feels deconditioned. Coughs up a small amount of green mucus up at times. She sees ID later this week. She reports she feels a lot better. Not much of an appetite.  She was also treated for a fever blister with Valtrex. Notes this has improved and they did discharge her with topical treatment though she lost the bottle and it was too expensive to replace.  She reports continued issues with sleep. Has had long-term issues with this related to anxiety and depression and this initially improved with treatment with Zoloft. It takes her 2 hours to fall asleep. Does get 5-6 hours of sleep a night. Watches TV the hour before bed. Does drink tea at dinner. Does report some depression and anxiety surrounding her mother being in a nursing home. No SI or HI.  PMH: nonsmoker.   ROS see history of present illness  Objective  Physical Exam Vitals:   08/17/16 1135  BP: 128/80  Pulse: 72  Temp: 98.3 F (36.8 C)    BP Readings from Last 3 Encounters:  08/17/16 128/80  08/10/16 (!) 142/81  05/21/16 122/70   Wt Readings from Last 3 Encounters:  08/17/16 225 lb 12.8 oz (102.4 kg)  08/07/16 240 lb 6.4 oz (109 kg)  05/21/16 228 lb 9.6 oz (103.7 kg)    Physical Exam  Constitutional: No distress.  HENT:  Head: Normocephalic and atraumatic.  Mouth/Throat: Oropharynx is clear and moist. No oropharyngeal exudate.  Eyes: Conjunctivae are normal. Pupils are equal, round, and reactive to  light.  Cardiovascular: Normal rate, regular rhythm and normal heart sounds.   Pulmonary/Chest: Effort normal and breath sounds normal.  Neurological: She is alert. Gait normal.  Skin: Skin is warm and dry. She is not diaphoretic.  One small improving cold sore noted on mid upper lip   Assessment/Plan: Please see individual problem list.  Sepsis [facility]) Patient recently hospitalized for sepsis related to strep pyogenes infection. She has been doing well since discharge. Continuing to take ampicillin. Benign exam today. Discharge summary reviewed. Medications reviewed with patient. She'll continue to monitor. She has follow-up set up with infectious disease later this week for recheck as well. Given return precautions.  Cold sore Improving. Encouraged over-the-counter medications and encouraged her to look for her topical prescription medication. If they are unable to find it we could resubmit it in a month or so to see if insurance will cover it.  Anxiety and depression Sleep issues seem to be related to anxiety and depression and poor sleep hygiene. Discussed sleep hygiene. We will increase her zoloft to 150 for depression and anxiety. She'll follow-up in 6 weeks.    No orders of the defined types were placed in this encounter.   Meds ordered this encounter  Medications  . sertraline (ZOLOFT) 100 MG tablet    Sig: Take 1.5 tablets (150 mg total) by mouth every evening.    Dispense:  135 tablet    Refill:  1    Diane AlarEric Diane Bunning, MD  [facility]

## 2016-10-02 ENCOUNTER — Encounter: Payer: Self-pay | Admitting: Family Medicine

## 2016-10-02 ENCOUNTER — Ambulatory Visit (INDEPENDENT_AMBULATORY_CARE_PROVIDER_SITE_OTHER): Payer: BC Managed Care – PPO | Admitting: Family Medicine

## 2016-10-02 VITALS — BP 132/90 | HR 67 | Temp 99.1°F | Wt 227.0 lb

## 2016-10-02 DIAGNOSIS — J309 Allergic rhinitis, unspecified: Secondary | ICD-10-CM

## 2016-10-02 DIAGNOSIS — R0609 Other forms of dyspnea: Secondary | ICD-10-CM | POA: Insufficient documentation

## 2016-10-02 DIAGNOSIS — K2100 Gastro-esophageal reflux disease with esophagitis, without bleeding: Secondary | ICD-10-CM

## 2016-10-02 DIAGNOSIS — F329 Major depressive disorder, single episode, unspecified: Secondary | ICD-10-CM | POA: Diagnosis not present

## 2016-10-02 DIAGNOSIS — F419 Anxiety disorder, unspecified: Secondary | ICD-10-CM

## 2016-10-02 LAB — CBC
HCT: 39.1 % (ref 35.0–45.0)
Hemoglobin: 12.7 g/dL (ref 11.7–15.5)
MCH: 29 pg (ref 27.0–33.0)
MCHC: 32.5 g/dL (ref 32.0–36.0)
MCV: 89.3 fL (ref 80.0–100.0)
MPV: 10.7 fL (ref 7.5–12.5)
PLATELETS: 319 10*3/uL (ref 140–400)
RBC: 4.38 MIL/uL (ref 3.80–5.10)
RDW: 16 % — ABNORMAL HIGH (ref 11.0–15.0)
WBC: 9.3 10*3/uL (ref 3.8–10.8)

## 2016-10-02 NOTE — Assessment & Plan Note (Signed)
Reports some dyspnea on exertion since her recent illness. Has not worsened or improved. Echo and EKG are reassuring from visitation. Discussed that this could be related to her reactive airway disease. Discussed possibility that it could be her heart and offered EKG today though she declined. She wanted to try using the albuterol to see if that would benefit. Discussed that if it does not provide any benefit she is to let us know so we can refer to cardiology. Check a CBC to rule out anemia. Given return precautions.

## 2016-10-02 NOTE — Patient Instructions (Addendum)
Nice to see you. We will check some lab work today and contacted with the results. Please try adding back Flonase to your allergy regimen. Please try using her albuterol inhaler the next time you have trouble breathing. If this is not beneficial you need to let us know so we can get used to see cardiology. If you develop persistent trouble breathing, or you develop chest pain or any new or changing symptoms please see medical attention.

## 2016-10-02 NOTE — Assessment & Plan Note (Signed)
Sleep improved. Anxiety stable. No depression. Continue current medications.

## 2016-10-02 NOTE — Progress Notes (Signed)
  Marikay AlarEric Cing [place], MD Phone: [phone]  Cyndie MullKathy M Walsh is a 61 y.o. female who presents today for follow-up.  Anxiety/depression: Notes no depression. Still has some intermittent anxiety. The increased dose of Zoloft didn't help significantly. She is sleeping better. She stopped drinking caffeine and soda late in the day. No surgery.  Allergies: Notes pre-consistent drainage and rhinorrhea. Some allergic conjunctivitis symptoms as well with itchy watery eyes. No sneezing. She is using eyedrops and taking second-generation antihistamine though continues to have some symptoms. Some mild cough. Flonase did help when her nose was congested.   GERD: Well controlled on omeprazole. He previously and she states she was advised to be on a PPI chronically. No current symptoms. No blood in her stool.  She does note since being ill a couple months ago that she has had some dyspnea when she tries to exert herself. She has no chest pain. No wheezing. No edema or swelling. No PND or orthopnea. Echo was unremarkable in the hospital. Prior EKG with no ischemic signs. She does have a history of reactive airway disease and takes breo. She has not been using her albuterol.   PMH: nonsmoker.   ROS see history of present illness  Objective  Physical Exam Vitals:   10/02/16 1602 10/02/16 1628  BP: (!) 142/90 132/90  Pulse: 67   Temp: 99.1 F (37.3 C)     BP Readings from Last 3 Encounters:  10/02/16 132/90  08/17/16 128/80  08/10/16 (!) 142/81   Wt Readings from Last 3 Encounters:  10/02/16 227 lb (103 kg)  08/17/16 225 lb 12.8 oz (102.4 kg)  08/07/16 240 lb 6.4 oz (109 kg)    Physical Exam  Constitutional: No distress.  HENT:  Head: Normocephalic and atraumatic.  Mouth/Throat: Oropharynx is clear and moist. No oropharyngeal exudate.  Normal TMs  Eyes: Pupils are equal, round, and reactive to light. Conjunctivae are normal.  Cardiovascular: Normal rate, regular rhythm and normal heart  sounds.   Pulmonary/Chest: Effort normal and breath sounds normal.  Musculoskeletal: She exhibits no edema.  Neurological: She is alert. Gait normal.  Skin: Skin is warm and dry. She is not diaphoretic.     Assessment/Plan: Please see individual problem list.  Allergic rhinitis Discussed adding back Flonase. She'll continue second-generation antihistamine and allergy eyedrops.  Dyspnea on exertion Reports some dyspnea on exertion since her recent illness. Has not worsened or improved. Echo and EKG are reassuring from visitation. Discussed that this could be related to her reactive airway disease. Discussed possibility that it could be her heart and offered EKG today though she declined. She wanted to try using the albuterol to see if that would benefit. Discussed that if it does not provide any benefit she is to let us know so we can refer to cardiology. Check a CBC to rule out anemia. Given return precautions.  Anxiety and depression Sleep improved. Anxiety stable. No depression. Continue current medications.  Esophagitis, reflux Asymptomatic. Continue omeprazole.   Orders Placed This Encounter  Procedures  . CBC    Marikay AlarEric Luiscarlos Kaczmarczyk, MD [facility] [place]

## 2016-10-02 NOTE — Assessment & Plan Note (Signed)
Discussed adding back Flonase. She'll continue second-generation antihistamine and allergy eyedrops.

## 2016-10-02 NOTE — Assessment & Plan Note (Signed)
Asymptomatic.  Continue omeprazole. 

## 2016-10-28 ENCOUNTER — Other Ambulatory Visit: Payer: Self-pay | Admitting: Pulmonary Disease

## 2016-11-23 ENCOUNTER — Telehealth: Payer: Self-pay | Admitting: *Deleted

## 2016-11-23 ENCOUNTER — Telehealth: Payer: Self-pay | Admitting: Family Medicine

## 2016-11-23 ENCOUNTER — Emergency Department
Admission: EM | Admit: 2016-11-23 | Discharge: 2016-11-23 | Disposition: A | Discharge status: Home / Self Care | Payer: BC Managed Care – PPO | Attending: Emergency Medicine | Admitting: Emergency Medicine

## 2016-11-23 ENCOUNTER — Encounter: Payer: Self-pay | Admitting: Emergency Medicine

## 2016-11-23 DIAGNOSIS — E039 Hypothyroidism, unspecified: Secondary | ICD-10-CM | POA: Insufficient documentation

## 2016-11-23 DIAGNOSIS — Z79899 Other long term (current) drug therapy: Secondary | ICD-10-CM | POA: Insufficient documentation

## 2016-11-23 DIAGNOSIS — R42 Dizziness and giddiness: Secondary | ICD-10-CM | POA: Diagnosis present

## 2016-11-23 DIAGNOSIS — H81392 Other peripheral vertigo, left ear: Secondary | ICD-10-CM

## 2016-11-23 LAB — BASIC METABOLIC PANEL
Anion gap: 9 (ref 5–15)
BUN: 11 mg/dL (ref 6–20)
CHLORIDE: 105 mmol/L (ref 101–111)
CO2: 26 mmol/L (ref 22–32)
Calcium: 9.2 mg/dL (ref 8.9–10.3)
Creatinine, Ser: 0.79 mg/dL (ref 0.44–1.00)
GFR calc Af Amer: >60 mL/min (ref >60)
GLUCOSE: 115 mg/dL — AB (ref 65–99)
POTASSIUM: 4 mmol/L (ref 3.5–5.1)
Sodium: 140 mmol/L (ref 135–145)

## 2016-11-23 LAB — CBC
HEMATOCRIT: 38.1 % (ref 35.0–47.0)
Hemoglobin: 13 g/dL (ref 12.0–16.0)
MCH: 29.9 pg (ref 26.0–34.0)
MCHC: 34.1 g/dL (ref 32.0–36.0)
MCV: 87.8 fL (ref 80.0–100.0)
Platelets: 310 10*3/uL (ref 150–440)
RBC: 4.34 MIL/uL (ref 3.80–5.20)
RDW: 16.1 % — AB (ref 11.5–14.5)
WBC: 8.3 10*3/uL (ref 3.6–11.0)

## 2016-11-23 MED ORDER — ONDANSETRON 4 MG PO TBDP
4.0000 mg | ORAL_TABLET | Freq: Once | ORAL | Status: AC
  Administered 2016-11-23: 4 mg via ORAL
  Filled 2016-11-23: qty 1

## 2016-11-23 MED ORDER — MECLIZINE HCL 25 MG PO TABS: 25.0000 mg | ORAL_TABLET | Freq: Three times a day (TID) | ORAL | 0 refills | Status: DC | PRN

## 2016-11-23 MED ORDER — MECLIZINE HCL 25 MG PO TABS
50.0000 mg | ORAL_TABLET | Freq: Once | ORAL | Status: AC
  Administered 2016-11-23: 50 mg via ORAL
  Filled 2016-11-23: qty 2

## 2016-11-23 MED ORDER — ONDANSETRON 4 MG PO TBDP: 4.0000 mg | ORAL_TABLET | Freq: Three times a day (TID) | ORAL | 0 refills | Status: DC | PRN

## 2016-11-23 NOTE — Telephone Encounter (Signed)
Noted and agree. 

## 2016-11-23 NOTE — Telephone Encounter (Signed)
Patient Name: Diane Walsh DOB: Sep 05, 1955 Initial Comment wife woke up dizzy today, room is spinning, unable to get out of bed. vomiting twice due to nausea of dizziness. Nurse Assessment Nurse: Charna Elizabeth, RN, Cathy Date/Time (Eastern Time): 11/23/2016 11:28:21 AM Confirm and document reason for call. If symptomatic, describe symptoms. ---Caller states Averil developed vertigo and has vomited about two times this morning. No severe breathing difficulty. No injury in the past 3 days. Alert and responsive. Does the patient have any new or worsening symptoms? ---Yes Will a triage be completed? ---Yes Related visit to physician within the last 2 weeks? ---No Does the PT have any chronic conditions? (i.e. diabetes, asthma, etc.) ---Yes List chronic conditions. ---Acid Reflux, Thyroid, Asthma Is this a behavioral health or substance abuse call? ---No Guidelines Guideline Title Affirmed Question Affirmed Notes Dizziness - Vertigo SEVERE dizziness (vertigo) (e.g., unable to walk without assistance) Final Disposition User Go to ED Now (or PCP triage) Charna Elizabeth, RN, Cathy Referrals [facility] - ED Caller Disagree/Comply Comply Caller Understands Yes PreDisposition Call Doctor

## 2016-11-23 NOTE — ED Provider Notes (Signed)
[facility] Emergency Department Provider Note  ____________________________________________   First MD Initiated Contact with Patient 11/23/16 1453     (approximate)  I have reviewed the triage vital signs and the nursing notes.   HISTORY  Chief Complaint Dizziness   HPI Diane Walsh is a 61 y.o. female who comes to the emergency department with paroxysms of room spinning vertigo that began last night in the middle the night. She said she rolled over violently in bed and suddenly felt like the entire world was spinning around her associated with nausea. This seemed to resolve when lying still and she fell asleep. She awoke at 6 AM and turned over the world started spinning again. The symptoms are not constant and they go away completely when she is not moving. She is currently asymptomatic. She does report mild gradual onset not maximal onset throbbing headache similar to previous headaches. She denies double vision or blurred vision. She denies numbness or weakness. She is able to ambulate. She denies chest pain shortness of breath abdominal pain or vomiting.   Past Medical History:  Diagnosis Date  . Allergic rhinitis   . Chickenpox   . Depression   . GERD (gastroesophageal reflux disease)   . Stress incontinence   . Thyroid disease   . UTI (urinary tract infection)     Patient Active Problem List   Diagnosis Date Noted  . Dyspnea on exertion 10/02/2016  . Cold sore 08/17/2016  . Elevated blood pressure reading 04/10/2016  . Anxiety and depression 03/18/2015  . RAD (reactive airway disease) 03/18/2015  . Esophagitis, reflux 03/18/2015  . Avitaminosis D 07/13/2008  . Allergic rhinitis 08/23/2005  . Acid reflux 08/23/2005  . Adult hypothyroidism 08/23/2005  . Acne erythematosa 08/23/2005    Past Surgical History:  Procedure Laterality Date  . BREAST BIOPSY Right 11/28/2014   stereotactic biopsy - negative  . CHOLECYSTECTOMY    . HAND  SURGERY Right 02/12/2015   Dr. Amanda Pea; [facility]  . TONSILLECTOMY      Prior to Admission medications   Medication Sig Start Date End Date Taking? Authorizing Provider  azelastine (OPTIVAR) 0.05 % ophthalmic solution Place 1 drop into both eyes 2 (two) times daily as needed. 05/01/16 05/01/17  Merwyn Katos, MD  BREO ELLIPTA 100-25 MCG/INH AEPB INHALE 1 PUFF INTO THE LUNGS DAILY. 10/28/16   Merwyn Katos, MD  levothyroxine (SYNTHROID, LEVOTHROID) 75 MCG tablet TAKE 1 TABLET BY MOUTH EVERY DAY 05/12/16   Glori Luis, MD  meclizine (ANTIVERT) 25 MG tablet Take 1 tablet (25 mg total) by mouth 3 (three) times daily as needed for dizziness or nausea. 11/23/16   Merrily Brittle, MD  montelukast (SINGULAIR) 10 MG tablet TAKE 1 TABLET BY MOUTH EVERY DAY 06/02/16   Margaretann Loveless, PA-C  omeprazole (PRILOSEC) 20 MG capsule TAKE 1 CAPSULE (20 MG TOTAL) BY MOUTH 2 (TWO) TIMES DAILY BEFORE A MEAL. 05/12/16   Glori Luis, MD  ondansetron (ZOFRAN ODT) 4 MG disintegrating tablet Take 1 tablet (4 mg total) by mouth every 8 (eight) hours as needed for nausea or vomiting. 11/23/16   Merrily Brittle, MD  sertraline (ZOLOFT) 100 MG tablet Take 1.5 tablets (150 mg total) by mouth every evening. 08/17/16   Glori Luis, MD    Allergies Patient has no known allergies.  Family History  Problem Relation Age of Onset  . Hypothyroidism Mother   . Dementia Mother   . Hypertension Father   .  Diabetes Father   . Hypertension Sister   . Breast cancer Cousin   . Breast cancer Cousin     Social History Social History  Substance Use Topics  . Smoking status: Never Smoker  . Smokeless tobacco: Never Used  . Alcohol use Yes     Comment: Very Rarely    Review of Systems Constitutional: No fever/chills Eyes: No visual changes. ENT: No sore throat. Cardiovascular: Denies chest pain. Respiratory: Denies shortness of breath. Gastrointestinal: No abdominal pain.  No nausea, no  vomiting.  No diarrhea.  No constipation. Genitourinary: Negative for dysuria. Musculoskeletal: Negative for back pain. Skin: Negative for rash. Neurological: positive for headache positive for vertigo   ____________________________________________   PHYSICAL EXAM:  VITAL SIGNS: ED Triage Vitals  Enc Vitals Group     BP 11/23/16 1225 (!) 151/71     Pulse Rate 11/23/16 1225 67     Resp 11/23/16 1225 18     Temp 11/23/16 1225 98.9 F (37.2 C)     Temp Source 11/23/16 1225 Oral     SpO2 11/23/16 1225 97 %     Weight 11/23/16 1226 227 lb (103 kg)     Height 11/23/16 1226  (1.651 m)     Head Circumference --      Peak Flow --      Pain Score 11/23/16 1228 7     Pain Loc --      Pain Edu? --      Excl. in GC? --     Constitutional: alert and oriented 4 appears uncomfortable holding her head in her hands Eyes: PERRL EOMI. fatigable leftward beating nystagmus no vertical nystagmus noted artery nystagmus Head: Atraumatic. no lesions in either ear Nose: No congestion/rhinnorhea. Mouth/Throat: No trismus Neck: No stridor.   Cardiovascular: Normal rate, regular rhythm. Grossly normal heart sounds.  Good peripheral circulation. Respiratory: Normal respiratory effort.  No retractions. Lungs CTAB and moving good air Gastrointestinal: soft nontender Musculoskeletal: No lower extremity edema   Neurologic:  Normal speech and language.  aside from  the nystagmus cranial nerves II through XII are intact No pronator drift Normal finger-nose-fingerNegative Romberg Skin:  Skin is warm, dry and intact. No rash noted. Psychiatric: Mood and affect are normal. Speech and behavior are normal.    ____________________________________________   DIFFERENTIAL includes but not limited to  peripheral vertigo, central vertigo ____________________________________________   LABS (all labs ordered are listed, but only abnormal results are displayed)  Labs Reviewed  BASIC METABOLIC PANEL  - Abnormal; Notable for the following:       Result Value   Glucose, Bld 115 (*)    All other components within normal limits  CBC - Abnormal; Notable for the following:    RDW 16.1 (*)    All other components within normal limits  URINALYSIS, COMPLETE (UACMP) WITH MICROSCOPIC  CBG MONITORING, ED    blood work reviewed and interpreted by me no signs of acute disease __________________________________________  EKG  ED ECG REPORT I, Merrily Brittle, the attending physician, personally viewed and interpreted this ECG.  Date: 11/23/2016 EKG Time:  Rate: 68 Rhythm: normal sinus rhythm QRS Axis: normal Intervals: normal ST/T Wave abnormalities: normal Narrative Interpretation: no evidence of acute ischemia  ____________________________________________  RADIOLOGY   ____________________________________________   PROCEDURES  Procedure(s) performed: no  Procedures  Critical Care performed: no  Observation: no ____________________________________________   INITIAL IMPRESSION / ASSESSMENT AND PLAN / ED COURSE  Pertinent labs & imaging results that were available  during my care of the patient were reviewed by me and considered in my medical decision making (see chart for details).  The patient has fatigable leftward beating nystagmus with symptoms that are only with movement. She has complete resolution in between and otherwise her neurological exam is completely normal. She has no signs of central vertigo. I've explained the Epley maneuver and meclizine and also the importance of follow-up with otolaryngology. Strict return precautions given.  the patient her husband verbalize understanding and agreement with the plan.      ____________________________________________   FINAL CLINICAL IMPRESSION(S) / ED DIAGNOSES  Final diagnoses:  Peripheral vertigo involving left ear      NEW MEDICATIONS STARTED DURING THIS VISIT:  Discharge Medication List as of 11/23/2016   3:37 PM    START taking these medications   Details  meclizine (ANTIVERT) 25 MG tablet Take 1 tablet (25 mg total) by mouth 3 (three) times daily as needed for dizziness or nausea., Starting Mon 11/23/2016, Print    ondansetron (ZOFRAN ODT) 4 MG disintegrating tablet Take 1 tablet (4 mg total) by mouth every 8 (eight) hours as needed for nausea or vomiting., Starting Mon 11/23/2016, Print         Note:  This document was prepared using Dragon voice recognition software and may include unintentional dictation errors.     Merrily Brittle, MD 11/23/16 1601

## 2016-11-23 NOTE — ED Notes (Signed)
meds given

## 2016-11-23 NOTE — Telephone Encounter (Signed)
Patients husband was on the other line with team health. They advised him to take patient to ED or urgent care to be evaluated. Spouse stated he would comply. Will follow up

## 2016-11-23 NOTE — Telephone Encounter (Signed)
FYI

## 2016-11-23 NOTE — Discharge Instructions (Signed)
Fortunately today you did not have a stroke. Please perform the Epley maneuver at home 4-5 times a day to the LEFT in follow-up with otolaryngology within the next week for reevaluation. Return to the emergency department sooner for any concerns whatsoever.  It was a pleasure to take care of you today, and thank you for coming to our emergency department.  If you have any questions or concerns before leaving please ask the nurse to grab me and I'm more than happy to go through your aftercare instructions again.  If you were prescribed any opioid pain medication today such as Norco, Vicodin, Percocet, morphine, hydrocodone, or oxycodone please make sure you do not drive when you are taking this medication as it can alter your ability to drive safely.  If you have any concerns once you are home that you are not improving or are in fact getting worse before you can make it to your follow-up appointment, please do not hesitate to call 911 and come back for further evaluation.  Merrily Brittle, MD  Results for orders placed or performed during the hospital encounter of 11/23/16  Basic metabolic panel  Result Value Ref Range   Sodium 140 135 - 145 mmol/L   Potassium 4.0 3.5 - 5.1 mmol/L   Chloride 105 101 - 111 mmol/L   CO2 26 22 - 32 mmol/L   Glucose, Bld 115 (H) 65 - 99 mg/dL   BUN 11 6 - 20 mg/dL   Creatinine, Ser 9.62 0.44 - 1.00 mg/dL   Calcium 9.2 8.9 - 95.2 mg/dL   GFR calc non Af Amer >60 >60 mL/min   GFR calc Af Amer >60 >60 mL/min   Anion gap 9 5 - 15  CBC  Result Value Ref Range   WBC 8.3 3.6 - 11.0 K/uL   RBC 4.34 3.80 - 5.20 MIL/uL   Hemoglobin 13.0 12.0 - 16.0 g/dL   HCT 84.1 32.4 - 40.1 %   MCV 87.8 80.0 - 100.0 fL   MCH 29.9 26.0 - 34.0 pg   MCHC 34.1 32.0 - 36.0 g/dL   RDW 02.7 (H) 25.3 - 66.4 %   Platelets 310 150 - 440 K/uL   No results found.

## 2016-11-23 NOTE — ED Notes (Signed)
D/c inst to pt and family.   

## 2016-11-23 NOTE — Telephone Encounter (Signed)
Pt is having severe dizziness, she's having trouble walking  This was reported by pt's husband  *transferrded to team health

## 2016-11-23 NOTE — ED Triage Notes (Signed)
Patient presents to ED via POV from home with dizziness upon waking around 0600 this morning. Patient states she felt fine when she went to bed at 2330 last night. Patient also reports headache.

## 2016-11-25 ENCOUNTER — Other Ambulatory Visit: Payer: Self-pay | Admitting: Family Medicine

## 2016-11-25 DIAGNOSIS — E039 Hypothyroidism, unspecified: Secondary | ICD-10-CM

## 2016-11-25 DIAGNOSIS — K219 Gastro-esophageal reflux disease without esophagitis: Secondary | ICD-10-CM

## 2017-01-08 ENCOUNTER — Ambulatory Visit (INDEPENDENT_AMBULATORY_CARE_PROVIDER_SITE_OTHER): Payer: BC Managed Care – PPO | Admitting: Family Medicine

## 2017-01-08 ENCOUNTER — Encounter: Payer: Self-pay | Admitting: Family Medicine

## 2017-01-08 VITALS — BP 138/80 | HR 79 | Temp 98.3°F | Wt 240.2 lb

## 2017-01-08 DIAGNOSIS — E039 Hypothyroidism, unspecified: Secondary | ICD-10-CM

## 2017-01-08 DIAGNOSIS — F419 Anxiety disorder, unspecified: Secondary | ICD-10-CM

## 2017-01-08 DIAGNOSIS — R7303 Prediabetes: Secondary | ICD-10-CM | POA: Diagnosis not present

## 2017-01-08 DIAGNOSIS — J45909 Unspecified asthma, uncomplicated: Secondary | ICD-10-CM | POA: Diagnosis not present

## 2017-01-08 DIAGNOSIS — J309 Allergic rhinitis, unspecified: Secondary | ICD-10-CM | POA: Diagnosis not present

## 2017-01-08 DIAGNOSIS — F329 Major depressive disorder, single episode, unspecified: Secondary | ICD-10-CM

## 2017-01-08 DIAGNOSIS — H8113 Benign paroxysmal vertigo, bilateral: Secondary | ICD-10-CM

## 2017-01-08 DIAGNOSIS — F32A Depression, unspecified: Secondary | ICD-10-CM

## 2017-01-08 LAB — POCT GLYCOSYLATED HEMOGLOBIN (HGB A1C): Hemoglobin A1C: 5.9

## 2017-01-08 MED ORDER — LEVOTHYROXINE SODIUM 75 MCG PO TABS: 75.0000 ug | ORAL_TABLET | Freq: Every day | ORAL | 1 refills | Status: DC

## 2017-01-08 MED ORDER — MONTELUKAST SODIUM 10 MG PO TABS: 10.0000 mg | ORAL_TABLET | Freq: Every day | ORAL | 1 refills | Status: DC

## 2017-01-08 MED ORDER — AZELASTINE HCL 0.05 % OP SOLN: 1.0000 [drp] | Freq: Two times a day (BID) | OPHTHALMIC | 10 refills | Status: AC | PRN

## 2017-01-08 NOTE — Patient Instructions (Signed)
Nice to see you. I refilled your medications. Please do the exercises that we gave you for vertigo.  If not improving you need to see ENT again. Please use your inhaler daily.

## 2017-01-08 NOTE — Assessment & Plan Note (Signed)
Suspect cough related to undertreated reactive airway disease.  She will start using Breo daily.  If not beneficial could consider switching.  Also advised to follow-up with pulmonology though she deferred.

## 2017-01-08 NOTE — Progress Notes (Signed)
Marikay AlarEric Marli [place], MD Phone: [phone]  Cyndie MullKathy M Walsh is a 61 y.o. female who presents today for follow-up.  Reactive airway disease: Notes no shortness of breath.  No wheezing.  Does have intermittent cough.  This is been going on since last year.  Occasionally coughs up green mucus up.  She has not been using her inhaler daily.  This  Anxiety/depression: She notes that her depression is stable.  Notes her anxiety is increased apparent who is in a nursing facility.  She takes Zoloft.  She is sleeping better.  No SI or HI.  Hypothyroidism: Taking Synthroid.  No heat or cold intolerance.  No skin changes.  Vertigo: Evaluated in the emergency room.  Found to have peripheral vertigo and evaluate she underwent vestibular rehab for this.  Continues to have intermittent issues with this.  Occurs if she changes head position.  She has been taking Dramamine.  She has been doing her exercises.  Social History   Tobacco Use  Smoking Status Never Smoker  Smokeless Tobacco Never Used     ROS see history of present illness  Objective  Physical Exam Vitals:   01/08/17 1423  BP: 138/80  Pulse: 79  Temp: 98.3 F (36.8 C)  SpO2: 97%    BP Readings from Last 3 Encounters:  01/08/17 138/80  11/23/16 (!) 151/71  10/02/16 132/90   Wt Readings from Last 3 Encounters:  01/08/17 240 lb 3.2 oz (109 kg)  11/23/16 227 lb (103 kg)  10/02/16 227 lb (103 kg)    Physical Exam  Constitutional: No distress.  HENT:  Normal TMs  Cardiovascular: Normal rate, regular rhythm and normal heart sounds.  Pulmonary/Chest: Effort normal and breath sounds normal.  Musculoskeletal: She exhibits no edema.  Neurological: She is alert. Gait normal.  Right-sided Dix-Hallpike with nystagmus, left-sided Dix-Hallpike with vertigo though no nystagmus  Skin: Skin is warm and dry. She is not diaphoretic.     Assessment/Plan: Please see individual problem list.  Anxiety and depression Relatively stable.   Patient is hesitant to start on additional medication.  Does not want to see a therapist either.  She will continue to monitor  Adult hypothyroidism Synthroid.  RAD (reactive airway disease) Suspect cough related to undertreated reactive airway disease.  She will start using Breo daily.  If not beneficial could consider switching.  Also advised to follow-up with pulmonology though she deferred.  BPPV (benign paroxysmal positional vertigo), bilateral Most consistent with BPPV.  Right greater than left.  Discussed modified Epley maneuver.  If not improving she will need to see ENT again.   Olegario MessierKathy was seen today for follow-up.  Diagnoses and all orders for this visit:  Prediabetes -     POCT HgB A1C  Hypothyroidism -     levothyroxine (SYNTHROID, LEVOTHROID) 75 MCG tablet; Take 1 tablet (75 mcg total) daily by mouth.  Allergic rhinitis -     montelukast (SINGULAIR) 10 MG tablet; Take 1 tablet (10 mg total) daily by mouth.  Anxiety and depression  Adult hypothyroidism  Reactive airway disease without complication, unspecified asthma severity, unspecified whether persistent  BPPV (benign paroxysmal positional vertigo), bilateral  Other orders -     azelastine (OPTIVAR) 0.05 % ophthalmic solution; Place 1 drop 2 (two) times daily as needed into both eyes.    Orders Placed This Encounter  Procedures  . POCT HgB A1C    Meds ordered this encounter  Medications  . dimenhyDRINATE (DRAMAMINE) 50 MG tablet    Sig: Take  50 mg every 8 (eight) hours as needed by mouth.  Marland Kitchen. azelastine (OPTIVAR) 0.05 % ophthalmic solution    Sig: Place 1 drop 2 (two) times daily as needed into both eyes.    Dispense:  6 mL    Refill:  10  . levothyroxine (SYNTHROID, LEVOTHROID) 75 MCG tablet    Sig: Take 1 tablet (75 mcg total) daily by mouth.    Dispense:  90 tablet    Refill:  1  . montelukast (SINGULAIR) 10 MG tablet    Sig: Take 1 tablet (10 mg total) daily by mouth.    Dispense:  90 tablet      Refill:  1     Marikay AlarEric Ubaldo Daywalt, MD [facility] [place]

## 2017-01-08 NOTE — Assessment & Plan Note (Signed)
Most consistent with BPPV.  Right greater than left.  Discussed modified Epley maneuver.  If not improving she will need to see ENT again.

## 2017-01-08 NOTE — Assessment & Plan Note (Signed)
Synthroid 

## 2017-01-08 NOTE — Assessment & Plan Note (Signed)
Relatively stable.  Patient is hesitant to start on additional medication.  Does not want to see a therapist either.  She will continue to monitor

## 2017-01-29 ENCOUNTER — Other Ambulatory Visit: Payer: Self-pay | Admitting: Pulmonary Disease

## 2017-02-03 ENCOUNTER — Telehealth: Payer: Self-pay | Admitting: Pulmonary Disease

## 2017-02-03 ENCOUNTER — Other Ambulatory Visit: Payer: Self-pay

## 2017-02-03 ENCOUNTER — Ambulatory Visit: Payer: BC Managed Care – PPO | Admitting: Family Medicine

## 2017-02-03 ENCOUNTER — Ambulatory Visit (INDEPENDENT_AMBULATORY_CARE_PROVIDER_SITE_OTHER): Payer: BC Managed Care – PPO | Admitting: Family Medicine

## 2017-02-03 ENCOUNTER — Encounter: Payer: Self-pay | Admitting: Family Medicine

## 2017-02-03 ENCOUNTER — Encounter: Payer: Self-pay | Admitting: *Deleted

## 2017-02-03 VITALS — BP 120/80 | HR 90 | Temp 98.7°F | Ht 65.0 in | Wt 238.0 lb

## 2017-02-03 DIAGNOSIS — J40 Bronchitis, not specified as acute or chronic: Secondary | ICD-10-CM | POA: Diagnosis not present

## 2017-02-03 DIAGNOSIS — J45909 Unspecified asthma, uncomplicated: Secondary | ICD-10-CM | POA: Diagnosis not present

## 2017-02-03 MED ORDER — PREDNISONE 20 MG PO TABS: ORAL_TABLET | ORAL | 0 refills | Status: DC

## 2017-02-03 MED ORDER — DOXYCYCLINE HYCLATE 100 MG PO TABS: 100.0000 mg | ORAL_TABLET | Freq: Two times a day (BID) | ORAL | 0 refills | Status: AC

## 2017-02-03 NOTE — Telephone Encounter (Signed)
Returned call to patient and offered appt. Pt states she was not offered an appt so called her pcp and will be seen today. Patient has f/u with DS on 03/12/17.

## 2017-02-03 NOTE — Telephone Encounter (Signed)
Pt calling stating she's had this cough and congestion for about 10 days She's taking mucinex  and been on delsym cough States she's had green material come up  Would like some advise on this  Please call back

## 2017-02-03 NOTE — Progress Notes (Signed)
Dr. Karleen HampshireSpencer T. Quatisha Zylka, MD, CAQ Sports Medicine [facility] Sports [facility] [address] [place], [address] Phone: [phone] Fax: [phone]  02/03/2017  Patient: Diane MullKathy M Walsh, MRN: [id number], DOB: [birth year], 61 y.o.  Primary Physician:  Glori LuisSonnenberg, Eric G, MD   Chief Complaint  Patient presents with  . Cough    with green phelgm  . Nasal Congestion  . Shortness of Breath   Subjective:   Diane MullKathy M Diane Walsh is a 61 y.o. very pleasant female patient who presents with the following:  Patient presents for presents evaluation of myalgias, nasal congestion, productive cough, rhinorrhea , sneezing and sore throat. Symptoms began > 1 week ago and are gradually worsening since that time.    A little over a week ago and had some matting of her eyes and wanted to fight it on her own and has gotten worse and worse. Some sore throat. Got really sick in June and had sepsis.   Risk Factors: pulm dz  The patient denies significant nausea, vomitting, diarrhea, rash, diffuse arthralgia or myalgia. They also deny high fever.   Past Medical History, Surgical History, Social History, Family History, Problem List, Medications, and Allergies have been reviewed and updated if relevant.  Patient Active Problem List   Diagnosis Date Noted  . BPPV (benign paroxysmal positional vertigo), bilateral 01/08/2017  . Dyspnea on exertion 10/02/2016  . Cold sore 08/17/2016  . Elevated blood pressure reading 04/10/2016  . Anxiety and depression 03/18/2015  . RAD (reactive airway disease) 03/18/2015  . Esophagitis, reflux 03/18/2015  . Avitaminosis D 07/13/2008  . Allergic rhinitis 08/23/2005  . Acid reflux 08/23/2005  . Adult hypothyroidism 08/23/2005  . Acne erythematosa 08/23/2005    Past Medical History:  Diagnosis Date  . Allergic rhinitis   . Chickenpox   . Depression   . GERD (gastroesophageal reflux disease)   . Stress incontinence   . Thyroid disease   . UTI (urinary  tract infection)     Past Surgical History:  Procedure Laterality Date  . BREAST BIOPSY Right 11/28/2014   stereotactic biopsy - negative  . CHOLECYSTECTOMY    . HAND SURGERY Right 02/12/2015   Dr[facility] Amanda PeaGramig[facility]  . TONSILLECTOMY      Social History   Socioeconomic History  . Marital status: Married    Spouse name: Not on file  . Number of children: 2  . Years of education: College  . Highest education level: Not on file  Social Needs  . Financial resource strain: Not on file  . Food insecurity - worry: Not on file  . Food insecurity - inability: Not on file  . Transportation needs - medical: Not on file  . Transportation needs - non-medical: Not on file  Occupational History  . Occupation: Full Time  Tobacco Use  . Smoking status: Never Smoker  . Smokeless tobacco: Never Used  Substance and Sexual Activity  . Alcohol use: Yes    Comment: Very Rarely  . Drug use: No  . Sexual activity: Not on file  Other Topics Concern  . Not on file  Social History Narrative  . Not on file    Family History  Problem Relation Age of Onset  . Hypothyroidism Mother   . Dementia Mother   . Hypertension Father   . Diabetes Father   . Hypertension Sister   . Breast cancer Cousin   . Breast cancer Cousin     No Known Allergies  Medication list reviewed  and updated in full in [facility].  ROS: GEN: Acute illness details above GI: Tolerating PO intake GU: maintaining adequate hydration and urination Pulm: No SOB Interactive and getting along well at home.  Otherwise, ROS is as per the HPI.  Objective:   BP 120/80   Pulse 90   Temp 98.7 F (37.1 C) (Oral)   Ht 5\' 5"  (1.651 m)   Wt 238 lb (108 kg)   SpO2 94%   BMI 39.61 kg/m    GEN: A and O x 3. WDWN. NAD.    ENT: Nose clear, ext NML.  No LAD.  No JVD.  TM's clear. Oropharynx clear.  PULM: Normal WOB, no distress. No crackles, wheezes, few rhonchi. CV: RRR, no M/G/R, No rubs, No JVD.     EXT: warm and well-perfused, No c/c/e. PSYCH: Pleasant and conversant.   Laboratory and Imaging Data: No results found.  Assessment and Plan:   Bronchitis  Reactive airway disease without complication, unspecified asthma severity, unspecified whether persistent  At this point, we reviewed supportive care. Given the length of time and risk factors, treat with medications below.  Medical decision making includes all plans, orders, medications, and patient instructions reviewed face to face.   Meds ordered this encounter  Medications  . doxycycline (VIBRA-TABS) 100 MG tablet    Sig: Take 1 tablet (100 mg total) by mouth 2 (two) times daily for 10 days.    Dispense:  20 tablet    Refill:  0  . predniSONE (DELTASONE) 20 MG tablet    Sig: 2 tabs po for 4 days, then 1 tab po for 4 days    Dispense:  12 tablet    Refill:  0    Follow-up: No Follow-up on file.  Signed,  Elpidio Galea. Zekiel Torian, MD     Medication List        Accurate as of 02/03/17  2:09 PM. Always use your most recent med list.          azelastine 0.05 % ophthalmic solution Commonly known as:  OPTIVAR Place 1 drop 2 (two) times daily as needed into both eyes.   BREO ELLIPTA 100-25 MCG/INH Aepb Generic drug:  fluticasone furoate-vilanterol TAKE 1 PUFF BY MOUTH EVERY DAY**NEEDS OFFICE VISIT**   dimenhyDRINATE 50 MG tablet Commonly known as:  DRAMAMINE   doxycycline 100 MG tablet Commonly known as:  VIBRA-TABS Take 1 tablet (100 mg total) by mouth 2 (two) times daily for 10 days.   fluticasone 50 MCG/ACT nasal spray Commonly known as:  FLONASE   levothyroxine 75 MCG tablet Commonly known as:  SYNTHROID, LEVOTHROID Take 1 tablet (75 mcg total) daily by mouth.   montelukast 10 MG tablet Commonly known as:  SINGULAIR Take 1 tablet (10 mg total) daily by mouth.   MUCINEX D 60-600 MG 12 hr tablet Generic drug:  pseudoephedrine-guaifenesin   omeprazole 20 MG capsule Commonly known as:   PRILOSEC TAKE 1 CAPSULE (20 MG TOTAL) BY MOUTH 2 (TWO) TIMES DAILY BEFORE A MEAL.   predniSONE 20 MG tablet Commonly known as:  DELTASONE 2 tabs po for 4 days, then 1 tab po for 4 days   sertraline 100 MG tablet Commonly known as:  ZOLOFT Take 1.5 tablets (150 mg total) by mouth every evening.       Where to Get Your Medications    [id number] [place], [place] - [address]  [address], [place]  [id number]   Phone:  [phone]   doxycycline 100 MG tablet  predniSONE 20 MG tablet

## 2017-02-15 ENCOUNTER — Other Ambulatory Visit: Payer: Self-pay | Admitting: Pulmonary Disease

## 2017-02-26 ENCOUNTER — Other Ambulatory Visit: Payer: Self-pay | Admitting: Family Medicine

## 2017-02-26 DIAGNOSIS — Z1231 Encounter for screening mammogram for malignant neoplasm of breast: Secondary | ICD-10-CM

## 2017-03-12 ENCOUNTER — Encounter: Payer: Self-pay | Admitting: Pulmonary Disease

## 2017-03-12 ENCOUNTER — Ambulatory Visit: Payer: BC Managed Care – PPO | Admitting: Pulmonary Disease

## 2017-03-12 VITALS — BP 132/80 | HR 91 | Ht 65.0 in | Wt 239.0 lb

## 2017-03-12 DIAGNOSIS — J449 Chronic obstructive pulmonary disease, unspecified: Secondary | ICD-10-CM | POA: Diagnosis not present

## 2017-03-12 DIAGNOSIS — R05 Cough: Secondary | ICD-10-CM

## 2017-03-12 DIAGNOSIS — R053 Chronic cough: Secondary | ICD-10-CM

## 2017-03-12 NOTE — Progress Notes (Signed)
PULMONARY OFFICE FOLLOW UP NOTE  Requesting MD/Service:  Samule Dry, PA Date of initial consultation: 02/18/16 Reason for consultation: persistent bronchitis  PT PROFILE: 62 y.o. F never smoker referred for persistent bronchitis symptoms responsive to prednisone and antibiotics  DATA: CXR 01/24/16: Vague LLL infiltrate CXR 02/18/16: NACPD Spirometry 03/03/16: FVC 2.66 liters (79% pred), FEV1 2.07 liters (80% pred)  INTERVAL: Hospitalized 07/2016 with strep pyogenes bacteremia due to pharyngitis Developed vertigo in Set 2018  SUBJ:  Last seen March 2018.  She failed to make her follow-up due to the events noted above.  With regard to her chronic cough, is still present but minimally troublesome.  Is present mostly in the mornings with scant green mucus.  Overall she describes it as "okay".  He is not using the Breo inhaler and Singulair.  Uses Mucinex D as needed and finds it to be effective.  Denies CP, fever, purulent sputum, hemoptysis, LE edema and calf tenderness.  OBJ:  Vitals:   03/12/17 0949 03/12/17 0951  BP:  132/80  Pulse:  91  SpO2:  97%  Weight: 108.4 kg (239 lb)   Height: 5\' 5"  (1.651 m)   RA   EXAM:  Gen: WDWN, NAD HEENT: WNL Lungs: Full breath sounds without wheezes or other adventitious sounds Cardiovascular: RRR, no murmurs Abdomen: Soft, nontender, normal BS Ext: without clubbing, cyanosis, edema Neuro: CNs grossly intact, motor and sensory intact  DATA:  Current Outpatient Medications on File Prior to Visit  Medication Sig Dispense Refill  . azelastine (OPTIVAR) 0.05 % ophthalmic solution Place 1 drop 2 (two) times daily as needed into both eyes. 6 mL 10  . BREO ELLIPTA 100-25 MCG/INH AEPB TAKE 1 PUFF BY MOUTH EVERY DAY**NEEDS OFFICE VISIT** 60 each 0  . dimenhyDRINATE (DRAMAMINE) 50 MG tablet Take 50 mg every 8 (eight) hours as needed by mouth.    . fluticasone (FLONASE) 50 MCG/ACT nasal spray Place 2 sprays into both nostrils daily.    Marland Kitchen  levothyroxine (SYNTHROID, LEVOTHROID) 75 MCG tablet Take 1 tablet (75 mcg total) daily by mouth. 90 tablet 1  . montelukast (SINGULAIR) 10 MG tablet Take 1 tablet (10 mg total) daily by mouth. 90 tablet 1  . omeprazole (PRILOSEC) 20 MG capsule TAKE 1 CAPSULE (20 MG TOTAL) BY MOUTH 2 (TWO) TIMES DAILY BEFORE A MEAL. 180 capsule 1  . predniSONE (DELTASONE) 20 MG tablet 2 tabs po for 4 days, then 1 tab po for 4 days 12 tablet 0  . pseudoephedrine-guaifenesin (MUCINEX D) 60-600 MG 12 hr tablet Take 1 tablet by mouth every 12 (twelve) hours.    . sertraline (ZOLOFT) 100 MG tablet Take 1.5 tablets (150 mg total) by mouth every evening. 135 tablet 1   No current facility-administered medications on file prior to visit.     BMP Latest Ref Rng & Units 11/23/2016 08/09/2016 08/07/2016  Glucose 65 - 99 mg/dL 161(W) 960(A) 540(J)  BUN 6 - 20 mg/dL 11 7 10   Creatinine 0.44 - 1.00 mg/dL 8.11 9.14 7.82  BUN/Creat Ratio 12 - 28 - - -  Sodium 135 - 145 mmol/L 140 139 136  Potassium 3.5 - 5.1 mmol/L 4.0 4.1 4.3  Chloride 101 - 111 mmol/L 105 109 102  CO2 22 - 32 mmol/L 26 24 24   Calcium 8.9 - 10.3 mg/dL 9.2 9.5(A) 8.9    CBC Latest Ref Rng & Units 11/23/2016 10/02/2016 08/10/2016  WBC 3.6 - 11.0 K/uL 8.3 9.3 10.7  Hemoglobin 12.0 - 16.0 g/dL 21.3 12.7  10.4(L)  Hematocrit 35.0 - 47.0 % 38.1 39.1 31.2(L)  Platelets 150 - 440 K/uL 310 319 244    CXR 08/07/16: NACPD  IMPRESSION:   Chronic asthmatic bronchitis with mild chronic cough  PLAN:  1) continue Breo 100-25 inhaler daily 2) continue Singulair 3) follow-up in 4-5 months  Billy Fischeravid Breandan People, MD PCCM service Mobile [phone] Pager [phone] 03/12/2017

## 2017-03-12 NOTE — Patient Instructions (Signed)
Continue Breo inhaler Continue Singulair Follow up in 4-5 months

## 2017-03-19 ENCOUNTER — Ambulatory Visit: Payer: Self-pay

## 2017-03-19 ENCOUNTER — Ambulatory Visit
Admission: RE | Admit: 2017-03-19 | Discharge: 2017-03-19 | Disposition: A | Discharge status: Home / Self Care | Payer: BC Managed Care – PPO | Source: Ambulatory Visit | Attending: Family Medicine | Admitting: Family Medicine

## 2017-03-19 DIAGNOSIS — Z1231 Encounter for screening mammogram for malignant neoplasm of breast: Secondary | ICD-10-CM | POA: Insufficient documentation

## 2017-03-19 NOTE — Telephone Encounter (Signed)
Pt's husband called.  Reported a 2 day hx of cough, sore throat and nasal congestion.  Reported she started having a fever last night.  Reported fever of 102 degrees today at 4:30 PM.  Stated "her fever is getting worse."  Reported some increased shortness of breath.  Denied chest pain or tightness.  C/o green nasal drainage.  Husband verb. concern that the pt. has had pneumonia in the past, and tends to get very sick, very quickly.  Advised per protocol, she should go to UC. Husband verb. Understanding and stated [facility].       Reason for Disposition . [1] Fever > 101 F (38.3 C) AND [2] age > 7360  Answer Assessment - Initial Assessment Questions 1. WORST SYMPTOM: "What is your worst symptom?" (e.g., cough, runny nose, muscle aches, headache, sore throat, fever)      Fever of 102 at 4:30 pm 2. ONSET: "When did your flu symptoms start?"     Started about 2 days ago 3. COUGH: "How bad is the cough?"       Cough intermittent 4. RESPIRATORY DISTRESS: "Describe your breathing."     More labored 5. FEVER: "Do you have a fever?" If so, ask: "What is your temperature, how was it measured, and when did it start?"     102 degrees with digital thermometer 6. EXPOSURE: "Were you exposed to someone with influenza?"       Several people that have had an upper resp. infection  7. FLU VACCINE: "Did you get a flu shot this year?"    Yes; 11/2016 8. HIGH RISK DISEASE: "Do you any chronic medical problems?" (e.g., heart or lung disease, asthma, weak immune system, or other HIGH RISK conditions)     Hx pneumonia in past ; denied chronic heart or lung disease  9. PREGNANCY: "Is there any chance you are pregnant?" "When was your last menstrual period?"     No  10. OTHER SYMPTOMS: "Do you have any other symptoms?"  (e.g., runny nose, muscle aches, headache, sore throat)      C/o Nasal congestion, c/o sore throat, denied headache.  Protocols used: INFLUENZA -  SEASONAL-A-AH  Message from Terisa StarrBrittany L Taylor sent at 03/19/2017 4:28 PM EST   Summary: fever 102   Patients husband called and said she has had a fever of 102 the last two days. He states her ears, throat hurt & congestions. Has a cough. He wants someone to give him a call regarding this.

## 2017-03-19 NOTE — Telephone Encounter (Signed)
This encounter was created in error - please disregard.

## 2017-06-02 ENCOUNTER — Other Ambulatory Visit: Payer: Self-pay | Admitting: Family Medicine

## 2017-06-02 DIAGNOSIS — K219 Gastro-esophageal reflux disease without esophagitis: Secondary | ICD-10-CM

## 2017-06-18 ENCOUNTER — Ambulatory Visit: Payer: BC Managed Care – PPO | Admitting: Pulmonary Disease

## 2017-06-28 ENCOUNTER — Other Ambulatory Visit: Payer: Self-pay | Admitting: Family Medicine

## 2017-06-28 DIAGNOSIS — F419 Anxiety disorder, unspecified: Secondary | ICD-10-CM

## 2017-07-05 ENCOUNTER — Ambulatory Visit: Payer: BC Managed Care – PPO | Admitting: Pulmonary Disease

## 2017-07-05 ENCOUNTER — Encounter: Payer: Self-pay | Admitting: Pulmonary Disease

## 2017-07-05 VITALS — BP 134/82 | HR 95 | Ht 65.0 in | Wt 237.0 lb

## 2017-07-05 DIAGNOSIS — J4489 Other specified chronic obstructive pulmonary disease: Secondary | ICD-10-CM

## 2017-07-05 DIAGNOSIS — J449 Chronic obstructive pulmonary disease, unspecified: Secondary | ICD-10-CM | POA: Diagnosis not present

## 2017-07-05 DIAGNOSIS — J029 Acute pharyngitis, unspecified: Secondary | ICD-10-CM | POA: Diagnosis not present

## 2017-07-05 NOTE — Progress Notes (Addendum)
PULMONARY OFFICE FOLLOW UP NOTE  Requesting MD/Service:  Samule Dry, PA Date of initial consultation: 02/18/16 Reason for consultation: recurrent bronchitis  PT PROFILE: 62 y.o. F never smoker referred for persistent bronchitis symptoms responsive to prednisone and antibiotics  DATA: CXR 01/24/16: Vague LLL infiltrate CXR 02/18/16: NACPD Spirometry 03/03/16: FVC 2.66 liters (79% pred), FEV1 2.07 liters (80% pred)  INTERVAL: Suffered sinus infection in early April. Treated with pred, abx [facility]).   SUBJ:  As above. Feels that her sinus symptoms are improved but has persistent ST. Denies CP, fever, purulent sputum, hemoptysis, LE edema and calf tenderness   OBJ:  Vitals:   07/05/17 1618 07/05/17 1622  BP:  134/82  Pulse:  95  SpO2:  98%  Weight: 237 lb (107.5 kg)   Height:  (1.651 m)   RA   EXAM:  Gen: NAD HEENT: NCAT, minimal pharyngeal erythema Lungs: Full BS, no adventitious sounds Cardiovascular: Reg, no M Abdomen: soft, NABS Ext: no C/C/E Neuro: no focal deficits  DATA:  Current Outpatient Medications on File Prior to Visit  Medication Sig Dispense Refill  . albuterol (PROAIR HFA) 108 (90 Base) MCG/ACT inhaler Inhale 2 puffs into the lungs every 4 (four) hours as needed for wheezing or shortness of breath.    Marland Kitchen azelastine (OPTIVAR) 0.05 % ophthalmic solution Place 1 drop 2 (two) times daily as needed into both eyes. 6 mL 10  . BREO ELLIPTA 100-25 MCG/INH AEPB TAKE 1 PUFF BY MOUTH EVERY DAY**NEEDS OFFICE VISIT** 60 each 0  . dimenhyDRINATE (DRAMAMINE) 50 MG tablet Take 50 mg every 8 (eight) hours as needed by mouth.    . fluticasone (FLONASE) 50 MCG/ACT nasal spray Place 2 sprays into both nostrils daily.    Marland Kitchen levothyroxine (SYNTHROID, LEVOTHROID) 75 MCG tablet Take 1 tablet (75 mcg total) daily by mouth. 90 tablet 1  . montelukast (SINGULAIR) 10 MG tablet Take 1 tablet (10 mg total) daily by mouth. 90 tablet 1  . omeprazole (PRILOSEC) 20 MG  capsule TAKE 1 CAPSULE (20 MG TOTAL) BY MOUTH 2 (TWO) TIMES DAILY BEFORE A MEAL. 180 capsule 1  . pseudoephedrine-guaifenesin (MUCINEX D) 60-600 MG 12 hr tablet Take 1 tablet by mouth every 12 (twelve) hours.    . sertraline (ZOLOFT) 100 MG tablet TAKE 1.5 TABLETS (150 MG TOTAL) BY MOUTH EVERY EVENING. 135 tablet 1   No current facility-administered medications on file prior to visit.     BMP Latest Ref Rng & Units 11/23/2016 08/09/2016 08/07/2016  Glucose 65 - 99 mg/dL 578(I) 696(E) 952(W)  BUN 6 - 20 mg/dL Creatinine 0.44 - 1.00 mg/dL 4.13 2.44 0.10  BUN/Creat Ratio 12 - 28 - - -  Sodium 135 - 145 mmol/L 140 139 136  Potassium 3.5 - 5.1 mmol/L 4.0 4.1 4.3  Chloride 101 - 111 mmol/L 105 109 102  CO2 22 - 32 mmol/L Calcium 8.9 - 10.3 mg/dL 9.2 2.7(O) 8.9    CBC Latest Ref Rng & Units 11/23/2016 10/02/2016 08/10/2016  WBC 3.6 - 11.0 K/uL 8.3 9.3 10.7  Hemoglobin 12.0 - 16.0 g/dL 53.6 64.4 10.4(L)  Hematocrit 35.0 - 47.0 % 38.1 39.1 31.2(L)  Platelets 150 - 440 K/uL 310 319 244    CXR: NNF  IMPRESSION:  Chronic asthmatic bronchitis  Recurrent pharyngitis   The persistent ST could possibly be due to ICS   PLAN:  1) continue Breo 100-25 inhaler daily (for now). I emphasized that se should  rinse her mouth and throat thoroughly after use 2) continue Singulair 3) Continue PRN albuterol 4) ROV 4-6 months  Billy Fischer, MD PCCM service Mobile [phone] Pager [phone] 07/05/2017

## 2017-07-05 NOTE — Patient Instructions (Signed)
Continue Breo inhaler.  Make sure you rinse your mouth and throat thoroughly after use Continue albuterol inhaler as needed Follow-up in 4 to 6 months or sooner as needed

## 2017-07-20 ENCOUNTER — Other Ambulatory Visit: Payer: Self-pay | Admitting: Pulmonary Disease

## 2017-07-20 ENCOUNTER — Telehealth: Payer: Self-pay | Admitting: Pulmonary Disease

## 2017-07-20 MED ORDER — FLUTICASONE FUROATE-VILANTEROL 100-25 MCG/INH IN AEPB: INHALATION_SPRAY | RESPIRATORY_TRACT | 2 refills | Status: DC

## 2017-07-20 MED ORDER — FLUTICASONE FUROATE-VILANTEROL 100-25 MCG/INH IN AEPB: INHALATION_SPRAY | RESPIRATORY_TRACT | 5 refills | Status: DC

## 2017-07-20 NOTE — Telephone Encounter (Signed)
°*  STAT* If patient is at the pharmacy, call can be transferred to refill team.   1. Which medications need to be refilled? (please list name of each medication and dose if known)  Brio 2. Which pharmacy/location (including street and city if local pharmacy) is [facility] [address]   3. Do they need a 30 day or 90 day supply? 90 day

## 2017-07-20 NOTE — Telephone Encounter (Signed)
Rx sent 

## 2017-09-22 ENCOUNTER — Telehealth: Payer: Self-pay | Admitting: Family Medicine

## 2017-09-22 NOTE — Telephone Encounter (Signed)
Noted. It looks like the patient is due for follow-up. Please contact her to get her scheduled. Thanks.

## 2017-09-22 NOTE — Telephone Encounter (Signed)
fyi

## 2017-09-22 NOTE — Telephone Encounter (Signed)
Copied from CRM [phone]#[id number]. Topic: Quick Communication - See Telephone Encounter >> Sep 22, 2017  2:44 PM Windy KalataMichael, Janie Capp L, NT wrote: CRM for notification. See Telephone encounter for: 09/22/17.  Barnetta Chapelndy Blickman is a Engineer, civil (consulting)nurse with [employer] and states the patient contacted them about her newly diagnoses of COPD. She states she was not taking her long acting inhaler medicine and instructed her than she should. Mardelle Mattendy states this was a Animatorcourtesy call.  Cb# [phone]

## 2017-09-23 NOTE — Telephone Encounter (Signed)
Patient is scheduled   

## 2017-09-27 ENCOUNTER — Other Ambulatory Visit: Payer: Self-pay | Admitting: Family Medicine

## 2017-09-27 DIAGNOSIS — J309 Allergic rhinitis, unspecified: Secondary | ICD-10-CM

## 2017-11-25 ENCOUNTER — Other Ambulatory Visit: Payer: Self-pay | Admitting: Family Medicine

## 2017-11-25 DIAGNOSIS — E039 Hypothyroidism, unspecified: Secondary | ICD-10-CM

## 2017-11-25 NOTE — Telephone Encounter (Signed)
Last refill pt will need an OV for more refills

## 2017-12-24 ENCOUNTER — Other Ambulatory Visit: Payer: Self-pay | Admitting: Family Medicine

## 2017-12-24 DIAGNOSIS — F419 Anxiety disorder, unspecified: Secondary | ICD-10-CM

## 2017-12-24 NOTE — Telephone Encounter (Signed)
30 day supply only with no refills pt is due for an OV with Dr. Birdie Sons

## 2018-01-07 ENCOUNTER — Encounter: Payer: Self-pay | Admitting: Pulmonary Disease

## 2018-01-07 ENCOUNTER — Ambulatory Visit: Payer: BC Managed Care – PPO | Admitting: Family Medicine

## 2018-01-07 ENCOUNTER — Ambulatory Visit: Payer: BC Managed Care – PPO | Admitting: Pulmonary Disease

## 2018-01-07 VITALS — BP 142/82 | HR 98 | Ht 65.0 in | Wt 246.8 lb

## 2018-01-07 DIAGNOSIS — J4530 Mild persistent asthma, uncomplicated: Secondary | ICD-10-CM | POA: Diagnosis not present

## 2018-01-07 MED ORDER — FLUTICASONE FUROATE-VILANTEROL 100-25 MCG/INH IN AEPB: INHALATION_SPRAY | RESPIRATORY_TRACT | 3 refills | Status: DC

## 2018-01-07 MED ORDER — ALBUTEROL SULFATE HFA 108 (90 BASE) MCG/ACT IN AERS: 1.0000 | INHALATION_SPRAY | RESPIRATORY_TRACT | 10 refills | Status: AC | PRN

## 2018-01-07 NOTE — Patient Instructions (Signed)
Continue Breo inhaler, 1 inhalation daily.  Rinse mouth after use  You may try on and off Singulair to determine whether it is of any benefit to you.  If you cannot tell a difference while off of this medication, it may be discontinued completely  I will refill all of your medications for the next year  Follow-up in 1 year.  Call sooner if needed

## 2018-01-11 ENCOUNTER — Encounter: Payer: Self-pay | Admitting: Pulmonary Disease

## 2018-01-11 NOTE — Progress Notes (Signed)
PULMONARY OFFICE FOLLOW UP NOTE  Requesting MD/Service:  Samule Dry, PA Primary MD: Kandyce Rud, MD Date of initial consultation: 02/18/16 Reason for consultation: recurrent bronchitis  PT PROFILE: 62 y.o. F never smoker referred for persistent bronchitis symptoms responsive to prednisone and antibiotics  DATA: CXR 01/24/16: Vague LLL infiltrate CXR 02/18/16: NACPD Spirometry 03/03/16: FVC 2.66 liters (79% pred), FEV1 2.07 liters (80% pred)  INTERVAL: Last seen 07/05/2017.  No major events.  SUBJ:  This is a scheduled office follow-up.  She has no new complaints.  Her sore throat (reported last visit) resolved.  She remains on Breo inhaler. She has used her rescue inhaler one time in the past 3 months.  She denies CP, fever, purulent sputum, hemoptysis, LE edema and calf tenderness.  OBJ:  Vitals:   01/07/18 1010 01/07/18 1012  BP:  (!) 142/82  Pulse:  98  SpO2:  96%  Weight: 246 lb 12.8 oz (111.9 kg)   Height: 5\' 5"  (1.651 m)   RA   EXAM:  Gen: NAD HEENT: NCAT, sclera white Neck: No JVD Lungs: breath sounds full, no wheezes or other adventitious sounds Cardiovascular: RRR, no murmurs Abdomen: Soft, nontender, normal BS Ext: without clubbing, cyanosis, edema Neuro: grossly intact Skin: Limited exam, no lesions noted   DATA:  Current Outpatient Medications on File Prior to Visit  Medication Sig Dispense Refill  . dimenhyDRINATE (DRAMAMINE) 50 MG tablet Take 50 mg every 8 (eight) hours as needed by mouth.    . fluticasone (FLONASE) 50 MCG/ACT nasal spray Place 2 sprays into both nostrils daily.    Marland Kitchen levothyroxine (SYNTHROID, LEVOTHROID) 88 MCG tablet levothyroxine 88 mcg tablet    . montelukast (SINGULAIR) 10 MG tablet TAKE 1 TABLET (10 MG TOTAL) DAILY BY MOUTH. 90 tablet 1  . omeprazole (PRILOSEC) 20 MG capsule TAKE 1 CAPSULE (20 MG TOTAL) BY MOUTH 2 (TWO) TIMES DAILY BEFORE A MEAL. 180 capsule 1  . pseudoephedrine-guaifenesin (MUCINEX D) 60-600 MG 12 hr tablet  Take 1 tablet by mouth every 12 (twelve) hours.    . sertraline (ZOLOFT) 100 MG tablet TAKE 1.5 TABLETS (150 MG TOTAL) BY MOUTH EVERY EVENING. 45 tablet 1   No current facility-administered medications on file prior to visit.     BMP Latest Ref Rng & Units 11/23/2016 08/09/2016 08/07/2016  Glucose 65 - 99 mg/dL 161(W) 960(A) 540(J)  BUN 6 - 20 mg/dL 11 7 10   Creatinine 0.44 - 1.00 mg/dL 8.11 9.14 7.82  BUN/Creat Ratio 12 - 28 - - -  Sodium 135 - 145 mmol/L 140 139 136  Potassium 3.5 - 5.1 mmol/L 4.0 4.1 4.3  Chloride 101 - 111 mmol/L 105 109 102  CO2 22 - 32 mmol/L 26 24 24   Calcium 8.9 - 10.3 mg/dL 9.2 9.5(A) 8.9    CBC Latest Ref Rng & Units 11/23/2016 10/02/2016 08/10/2016  WBC 3.6 - 11.0 K/uL 8.3 9.3 10.7  Hemoglobin 12.0 - 16.0 g/dL 21.3 08.6 10.4(L)  Hematocrit 35.0 - 47.0 % 38.1 39.1 31.2(L)  Platelets 150 - 440 K/uL 310 319 244    CXR: NNF  IMPRESSION:  Mild persistent asthma without complication    PLAN:  1) continue Breo 100-25, 1 inhalation daily.  Rinse mouth after use.  Refill prescription entered 2) I instructed that she may try off of Singulair inhaler as I doubt it is of any significant benefit to her.  If she notices no change in her respiratory symptoms, she may remain off of this medication. 3) Continue  PRN albuterol 4) follow-up in 1 year or sooner as needed  Billy Fischeravid Simonds, MD PCCM service Mobile [phone] Pager [phone] 01/11/2018

## 2018-03-09 ENCOUNTER — Other Ambulatory Visit: Payer: Self-pay | Admitting: Family Medicine

## 2018-03-09 DIAGNOSIS — Z1231 Encounter for screening mammogram for malignant neoplasm of breast: Secondary | ICD-10-CM

## 2018-04-01 ENCOUNTER — Ambulatory Visit
Admission: RE | Admit: 2018-04-01 | Discharge: 2018-04-01 | Disposition: A | Discharge status: Home / Self Care | Payer: BC Managed Care – PPO | Source: Ambulatory Visit | Attending: Family Medicine | Admitting: Family Medicine

## 2018-04-01 DIAGNOSIS — Z1231 Encounter for screening mammogram for malignant neoplasm of breast: Secondary | ICD-10-CM | POA: Insufficient documentation

## 2018-04-12 ENCOUNTER — Other Ambulatory Visit: Payer: Self-pay | Admitting: Family Medicine

## 2018-04-12 DIAGNOSIS — F419 Anxiety disorder, unspecified: Secondary | ICD-10-CM

## 2018-04-13 ENCOUNTER — Other Ambulatory Visit: Payer: Self-pay | Admitting: Family Medicine

## 2018-04-13 DIAGNOSIS — F419 Anxiety disorder, unspecified: Secondary | ICD-10-CM

## 2018-08-03 ENCOUNTER — Other Ambulatory Visit: Payer: Self-pay | Admitting: Family Medicine

## 2018-08-03 DIAGNOSIS — F419 Anxiety disorder, unspecified: Secondary | ICD-10-CM

## 2018-08-04 ENCOUNTER — Other Ambulatory Visit: Payer: Self-pay | Admitting: Family Medicine

## 2018-08-04 DIAGNOSIS — J309 Allergic rhinitis, unspecified: Secondary | ICD-10-CM

## 2018-11-04 IMAGING — MG MM DIGITAL SCREENING BILAT W/ TOMO W/ CAD
8 of 13 series · 8 of 29 positions shown · non-contrast
Comparison: Previous exam(s).

CLINICAL DATA: Screening.

EXAM:
2D DIGITAL SCREENING BILATERAL MAMMOGRAM WITH CAD AND ADJUNCT TOMO

[R MLO (1 of 2)]
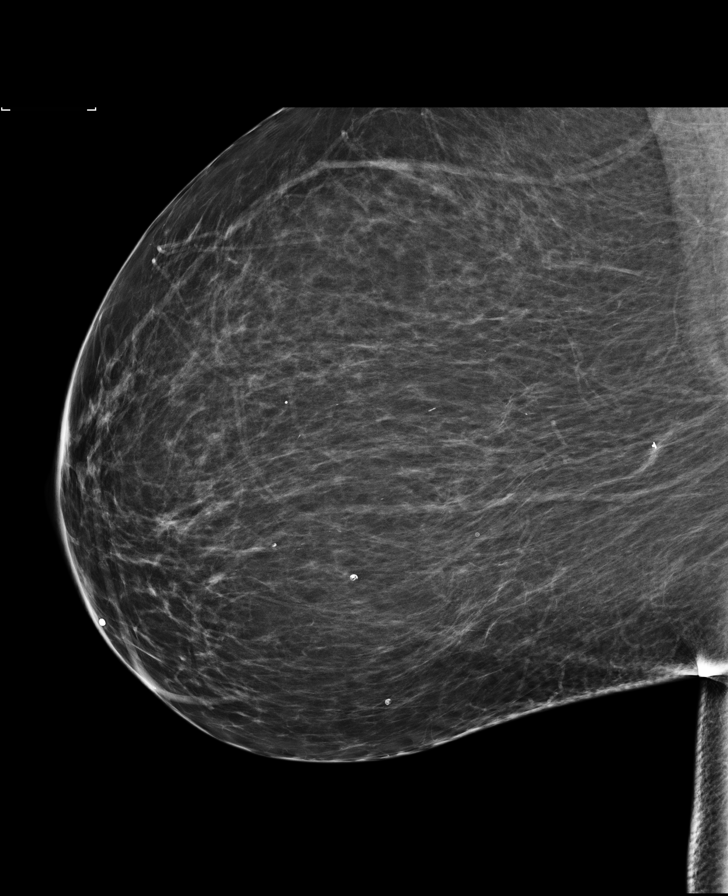

[L MLO synth-2D]
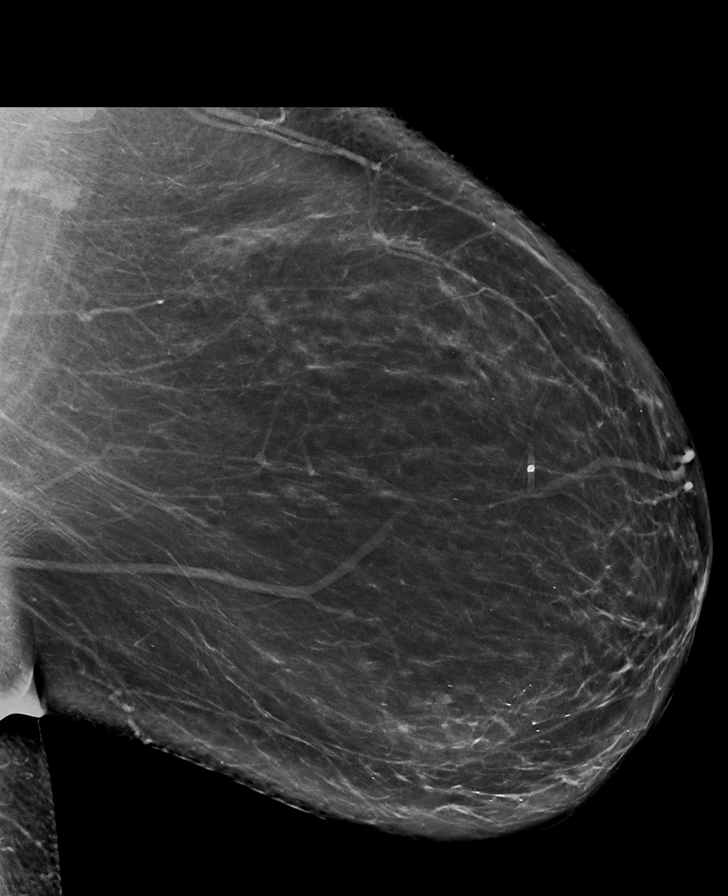

[R MLO (2 of 2)]
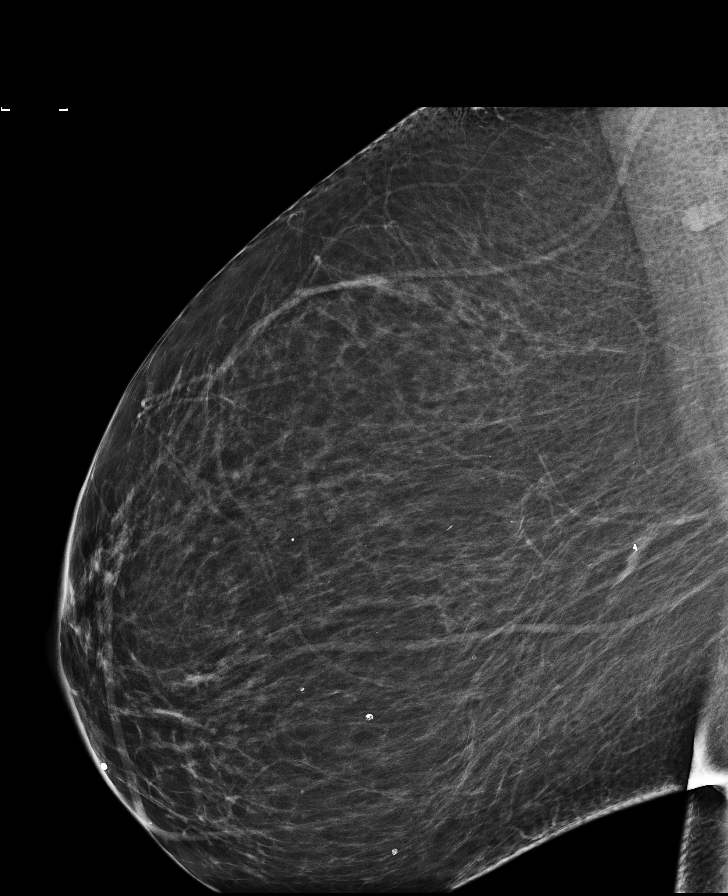

[R MLO synth-2D]
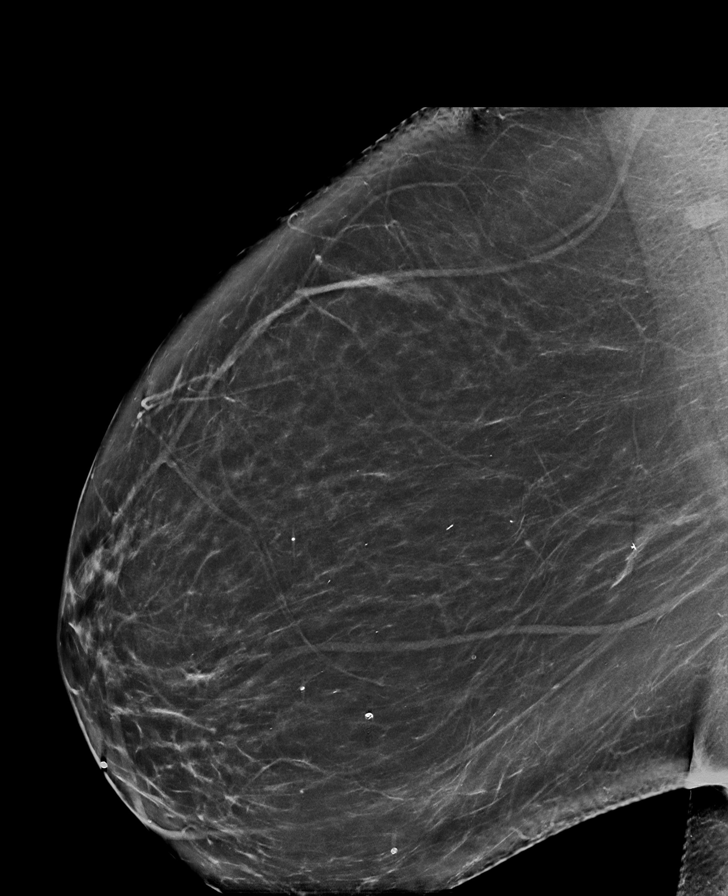

[R CC]
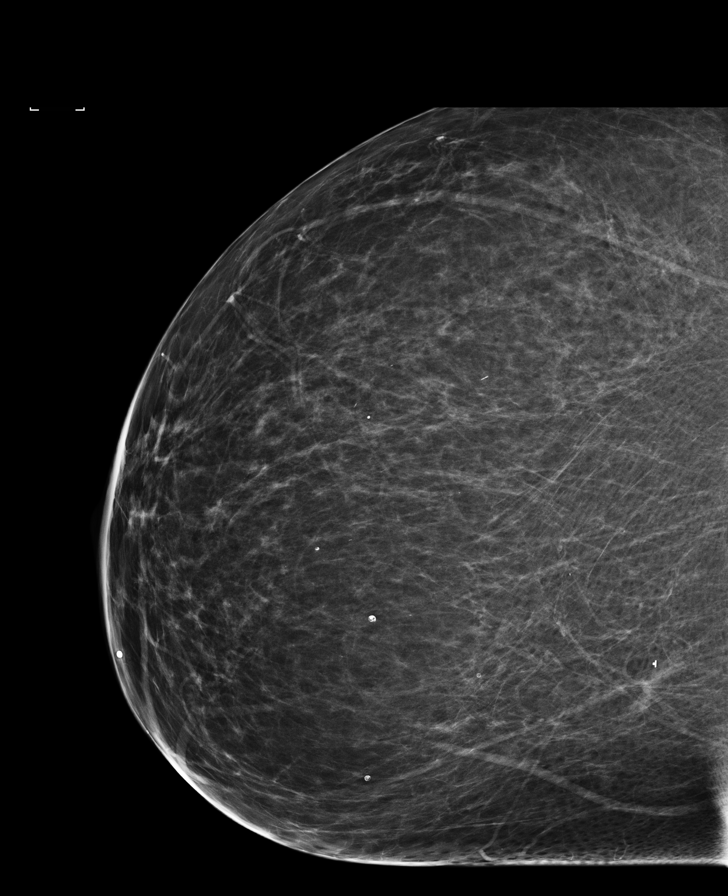

[R CC synth-2D]
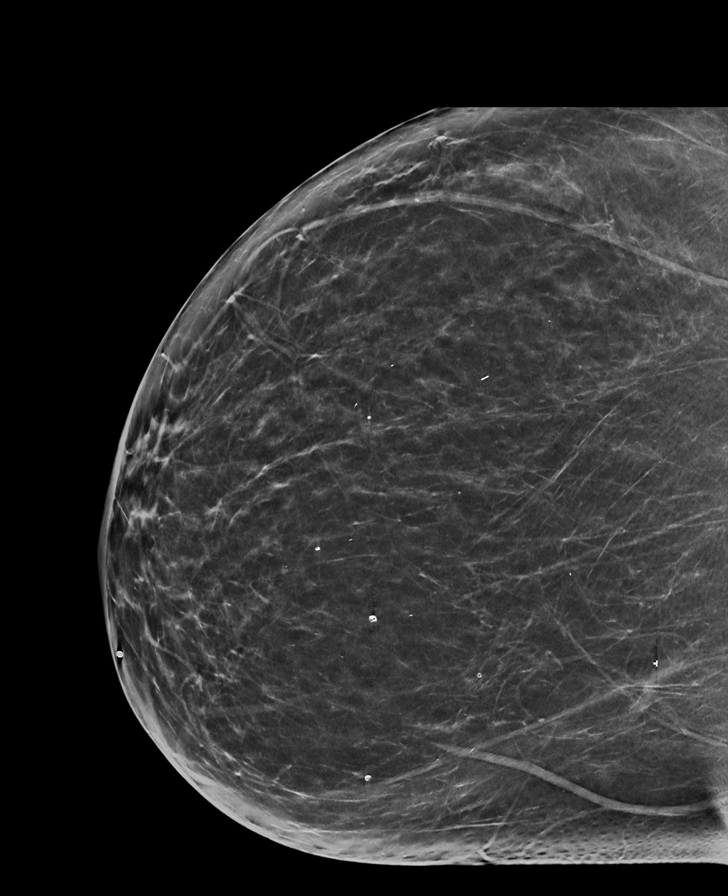

[L MLO]
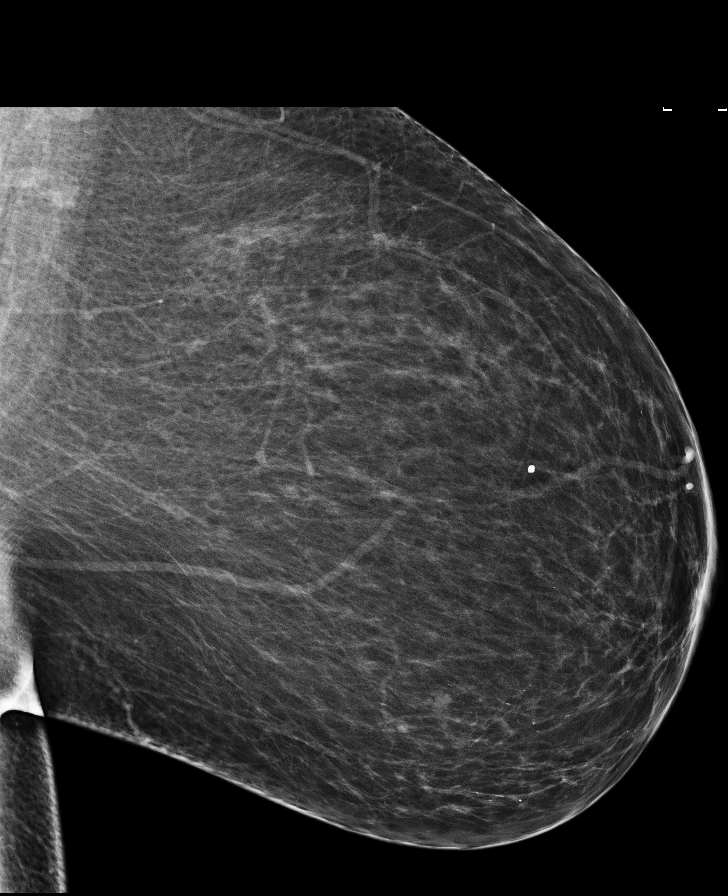

[L CC]
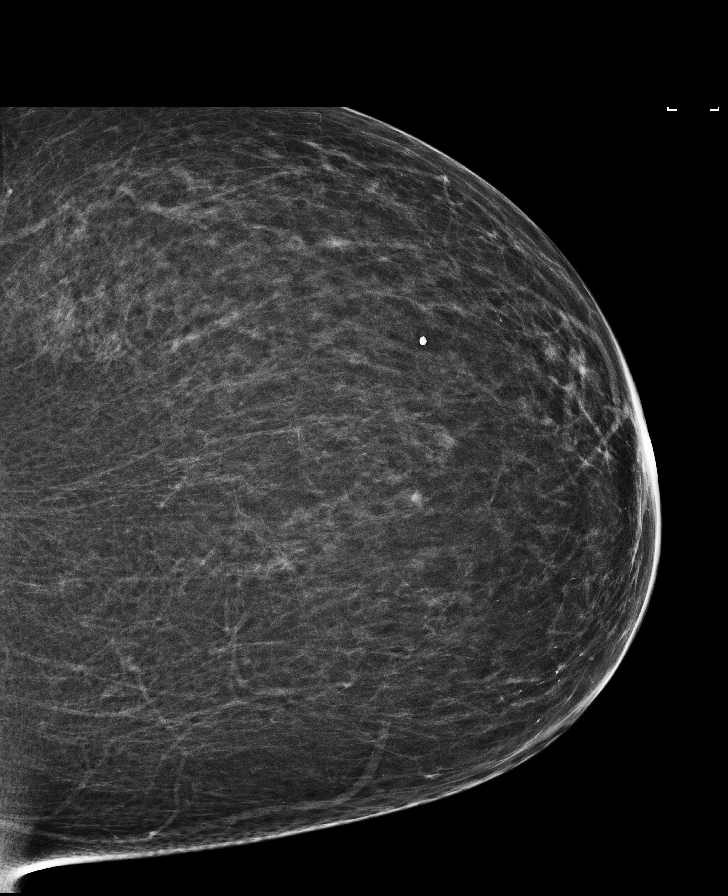

[8 of 29 positions shown; findings below may reference images not displayed]

ACR Breast Density Category b: There are scattered areas of
fibroglandular density.
FINDINGS: There are no findings suspicious for malignancy. Images were
processed with CAD.
IMPRESSION: No mammographic evidence of malignancy. A result letter of this
screening mammogram will be mailed directly to the patient.

RECOMMENDATION:
Screening mammogram in one year. (Code:97-6-RS4)

BI-RADS CATEGORY  1: Negative.

## 2019-02-17 ENCOUNTER — Other Ambulatory Visit: Payer: Self-pay | Admitting: Family Medicine

## 2019-02-17 DIAGNOSIS — F419 Anxiety disorder, unspecified: Secondary | ICD-10-CM

## 2019-02-19 NOTE — Telephone Encounter (Signed)
Patient is no longer under my care. She has established with a new provider. This medication was refused.

## 2019-03-16 ENCOUNTER — Other Ambulatory Visit: Payer: Self-pay | Admitting: Family Medicine

## 2019-03-16 DIAGNOSIS — Z1231 Encounter for screening mammogram for malignant neoplasm of breast: Secondary | ICD-10-CM

## 2019-04-07 ENCOUNTER — Ambulatory Visit
Admission: RE | Admit: 2019-04-07 | Discharge: 2019-04-07 | Disposition: A | Discharge status: Home / Self Care | Payer: BC Managed Care – PPO | Source: Ambulatory Visit | Attending: Family Medicine | Admitting: Family Medicine

## 2019-04-07 DIAGNOSIS — Z1231 Encounter for screening mammogram for malignant neoplasm of breast: Secondary | ICD-10-CM | POA: Diagnosis present

## 2019-05-19 ENCOUNTER — Other Ambulatory Visit: Payer: Self-pay | Admitting: Family Medicine

## 2019-05-19 DIAGNOSIS — F419 Anxiety disorder, unspecified: Secondary | ICD-10-CM

## 2020-03-28 ENCOUNTER — Other Ambulatory Visit: Payer: Self-pay | Admitting: Family Medicine

## 2020-03-28 DIAGNOSIS — Z1231 Encounter for screening mammogram for malignant neoplasm of breast: Secondary | ICD-10-CM

## 2020-04-22 ENCOUNTER — Other Ambulatory Visit: Payer: Self-pay

## 2020-04-22 ENCOUNTER — Ambulatory Visit
Admission: RE | Admit: 2020-04-22 | Discharge: 2020-04-22 | Disposition: A | Discharge status: Home / Self Care | Payer: BC Managed Care – PPO | Source: Ambulatory Visit | Attending: Family Medicine | Admitting: Family Medicine

## 2020-04-22 DIAGNOSIS — Z1231 Encounter for screening mammogram for malignant neoplasm of breast: Secondary | ICD-10-CM | POA: Diagnosis present

## 2020-10-21 ENCOUNTER — Ambulatory Visit
Admission: RE | Admit: 2020-10-21 | Discharge: 2020-10-21 | Disposition: A | Discharge status: Home / Self Care | Payer: Medicare PPO | Attending: Internal Medicine | Admitting: Internal Medicine

## 2020-10-21 ENCOUNTER — Ambulatory Visit: Payer: Medicare PPO | Admitting: Certified Registered"

## 2020-10-21 ENCOUNTER — Other Ambulatory Visit: Payer: Self-pay

## 2020-10-21 ENCOUNTER — Encounter: Payer: Self-pay | Admitting: Internal Medicine

## 2020-10-21 ENCOUNTER — Encounter
Admission: RE | Disposition: A | Discharge status: Home / Self Care | Payer: Self-pay | Source: Home / Self Care | Attending: Internal Medicine

## 2020-10-21 DIAGNOSIS — K640 First degree hemorrhoids: Secondary | ICD-10-CM | POA: Insufficient documentation

## 2020-10-21 DIAGNOSIS — Z79899 Other long term (current) drug therapy: Secondary | ICD-10-CM | POA: Insufficient documentation

## 2020-10-21 DIAGNOSIS — Z7951 Long term (current) use of inhaled steroids: Secondary | ICD-10-CM | POA: Diagnosis not present

## 2020-10-21 DIAGNOSIS — Z1211 Encounter for screening for malignant neoplasm of colon: Secondary | ICD-10-CM | POA: Insufficient documentation

## 2020-10-21 DIAGNOSIS — Z79890 Hormone replacement therapy (postmenopausal): Secondary | ICD-10-CM | POA: Diagnosis not present

## 2020-10-21 HISTORY — PX: COLONOSCOPY WITH PROPOFOL: SHX5780

## 2020-10-21 SURGERY — COLONOSCOPY WITH PROPOFOL
Anesthesia: General

## 2020-10-21 MED ORDER — PROPOFOL 500 MG/50ML IV EMUL
INTRAVENOUS | Status: DC | PRN
  Administered 2020-10-21: 150 ug/kg/min via INTRAVENOUS

## 2020-10-21 MED ORDER — PROPOFOL 10 MG/ML IV BOLUS
INTRAVENOUS | Status: DC | PRN
  Administered 2020-10-21: 80 mg via INTRAVENOUS

## 2020-10-21 MED ORDER — SODIUM CHLORIDE 0.9 % IV SOLN: INTRAVENOUS | Status: DC

## 2020-10-21 MED ORDER — GLYCOPYRROLATE 0.2 MG/ML IJ SOLN
INTRAMUSCULAR | Status: DC | PRN
  Administered 2020-10-21: .2 mg via INTRAVENOUS

## 2020-10-21 NOTE — Interval H&P Note (Signed)
History and Physical Interval Note:  10/21/2020 2:17 PM  Diane Walsh  has presented today for surgery, with the diagnosis of COLON CANCER SCREEN.  The various methods of treatment have been discussed with the patient and family. After consideration of risks, benefits and other options for treatment, the patient has consented to  Procedure(s): COLONOSCOPY WITH PROPOFOL (N/A) as a surgical intervention.  The patient's history has been reviewed, patient examined, no change in status, stable for surgery.  I have reviewed the patient's chart and labs.  Questions were answered to the patient's satisfaction.     [place], [place]

## 2020-10-21 NOTE — Anesthesia Postprocedure Evaluation (Signed)
Anesthesia Post Note  Patient: Diane Walsh  Procedure(s) Performed: COLONOSCOPY WITH PROPOFOL  Patient location during evaluation: Phase II Anesthesia Type: General Level of consciousness: awake and alert, awake and oriented Pain management: pain level controlled Vital Signs Assessment: post-procedure vital signs reviewed and stable Respiratory status: spontaneous breathing, nonlabored ventilation and respiratory function stable Cardiovascular status: blood pressure returned to baseline and stable Postop Assessment: no apparent nausea or vomiting Anesthetic complications: no   No notable events documented.   Last Vitals:  Vitals:   10/21/20 1457 10/21/20 1514  BP: 97/63 125/76  Pulse:    Resp:    Temp:    SpO2:      Last Pain:  Vitals:   10/21/20 1514  TempSrc:   PainSc: 0-No pain                 Manfred Arch

## 2020-10-21 NOTE — Transfer of Care (Signed)
Immediate Anesthesia Transfer of Care Note  Patient: Diane Walsh  Procedure(s) Performed: COLONOSCOPY WITH PROPOFOL  Patient Location: PACU and Endoscopy Unit  Anesthesia Type:General  Level of Consciousness: drowsy  Airway & Oxygen Therapy: Patient Spontanous Breathing and Patient connected to face mask  Post-op Assessment: Report given to RN  Post vital signs: stable  Last Vitals:  Vitals Value Taken Time  BP    Temp    Pulse    Resp    SpO2      Last Pain:  Vitals:   10/21/20 1408  TempSrc: Temporal  PainSc: 0-No pain         Complications: No notable events documented.

## 2020-10-21 NOTE — Anesthesia Preprocedure Evaluation (Signed)
Anesthesia Evaluation  Patient identified by MRN, date of birth, ID band Patient awake    Reviewed: Allergy & Precautions, NPO status , Patient's Chart, lab work & pertinent test results  Airway Mallampati: II  TM Distance: >3 FB Neck ROM: Full    Dental no notable dental hx.    Pulmonary neg pulmonary ROS,    Pulmonary exam normal        Cardiovascular negative cardio ROS Normal cardiovascular exam     Neuro/Psych PSYCHIATRIC DISORDERS Anxiety Depression negative neurological ROS     GI/Hepatic Neg liver ROS, GERD  Medicated and Controlled,  Endo/Other  Hypothyroidism Morbid obesity  Renal/GU negative Renal ROS  negative genitourinary   Musculoskeletal negative musculoskeletal ROS (+)   Abdominal (+) + obese,   Peds negative pediatric ROS (+)  Hematology negative hematology ROS (+)   Anesthesia Other Findings . Anemia  . Anxiety and depression 03/18/2015  Last Assessment & Plan: Relatively stable. Patient is hesitant to start on additional medication. Does not want to see a therapist either. She will continue to monitor  . Asthma without status asthmaticus, unspecified  . Bacteremia due to group B Streptococcus 08/19/2016  . COPD (chronic obstructive pulmonary disease) (CMS-HCC)  . Depression  . GERD (gastroesophageal reflux disease)  . Psoriasis  . RAD (reactive airway disease) 03/18/2015  Last Assessment & Plan: Suspect cough related to undertreated reactive airway disease. She will start using Breo daily. If not beneficial could consider switching. Also advised to follow-up with pulmonology though she deferred.  . Thyroid disease     Reproductive/Obstetrics negative OB ROS                            Anesthesia Physical Anesthesia Plan  ASA: 2  Anesthesia Plan: General   Post-op Pain Management:    Induction: Intravenous  PONV Risk Score and Plan: 2 and Propofol infusion and  TIVA  Airway Management Planned: Natural Airway and Nasal Cannula  Additional Equipment:   Intra-op Plan:   Post-operative Plan:   Informed Consent: I have reviewed the patients History and Physical, chart, labs and discussed the procedure including the risks, benefits and alternatives for the proposed anesthesia with the patient or authorized representative who has indicated his/her understanding and acceptance.       Plan Discussed with: CRNA, Anesthesiologist and Surgeon  Anesthesia Plan Comments:         Anesthesia Quick Evaluation

## 2020-10-21 NOTE — Op Note (Signed)
[facility] Gastro[facility]ology Patient Name: Diane Walsh Procedure Date: 10/21/2020 2:26 PM MRN: [id number] Account #: [id number] Date of Birth: 07/02/1955 Admit Type: Outpatient Age: 65 Room: [facility] ENDO ROOM 2 Gender: Female Note Status: Finalized Procedure:             Colonoscopy Indications:           Screening for colorectal malignant neoplasm Providers:             Boykin Nearing. Norma Fredrickson MD, MD Referring MD:          Hassell Halim MD (Referring MD) Medicines:             Propofol per Anesthesia Complications:         No immediate complications. Procedure:             Pre-Anesthesia Assessment:                        - The risks and benefits of the procedure and the                         sedation options and risks were discussed with the                         patient. All questions were answered and informed                         consent was obtained.                        - Patient identification and proposed procedure were                         verified prior to the procedure by the nurse. The                         procedure was verified in the procedure room.                        - ASA Grade Assessment: III - A patient with severe                         systemic disease.                        - After reviewing the risks and benefits, the patient                         was deemed in satisfactory condition to undergo the                         procedure.                        After obtaining informed consent, the colonoscope was                         passed under direct vision. Throughout the procedure,                         the patient's blood pressure,  pulse, and oxygen                         saturations were monitored continuously. The                         Colonoscope was introduced through the anus and                         advanced to the the cecum, identified by appendiceal                         orifice and ileocecal valve.  The colonoscopy was                         performed without difficulty. The patient tolerated                         the procedure well. The quality of the bowel                         preparation was good. The ileocecal valve, appendiceal                         orifice, and rectum were photographed. Findings:      The perianal and digital rectal examinations were normal. Pertinent       negatives include normal sphincter tone and no palpable rectal lesions.      Non-bleeding internal hemorrhoids were found during retroflexion. The       hemorrhoids were Grade I (internal hemorrhoids that do not prolapse).      The colon (entire examined portion) appeared normal. Impression:            - Non-bleeding internal hemorrhoids.                        - The entire examined colon is normal.                        - No specimens collected. Recommendation:        - Patient has a contact number available for                         emergencies. The signs and symptoms of potential                         delayed complications were discussed with the patient.                         Return to normal activities tomorrow. Written                         discharge instructions were provided to the patient.                        - Resume previous diet.                        - Continue present medications.                        -  Repeat colonoscopy in 10 years for screening                         purposes.                        - Return to GI office PRN.                        - The findings and recommendations were discussed with                         the patient. Procedure Code(s):     --- Professional ---                        H9622, Colorectal cancer screening; colonoscopy on                         individual not meeting criteria for high risk Diagnosis Code(s):     --- Professional ---                        K64.0, First degree hemorrhoids                        Z12.11, Encounter for  screening for malignant neoplasm                         of colon CPT copyright 2019 American [employer]. All rights reserved. The codes documented in this report are preliminary and upon coder review may  be revised to meet current compliance requirements. Stanton Kidney MD, MD 10/21/2020 2:47:05 PM This report has been signed electronically. Number of Addenda: 0 Note Initiated On: 10/21/2020 2:26 PM Scope Withdrawal Time: 0 hours 6 minutes 16 seconds  Total Procedure Duration: 0 hours 12 minutes 1 second  Estimated Blood Loss:  Estimated blood loss: none.      [facility]

## 2020-10-21 NOTE — H&P (Signed)
Outpatient short stay form Pre-procedure 10/21/2020 2:17 PM Joley Utecht K. Norma Fredrickson, M.D.  Primary Physician: Kandyce Rud, M.D.  Reason for visit:  Colon cancer screening  History of present illness:  Patient presents for colonoscopy for colon cancer screening. The patient denies complaints of abdominal pain, significant change in bowel habits, or rectal bleeding.      Current Facility-Administered Medications:    0.9 %  sodium chloride infusion, , Intravenous, Continuous, Ashtynn Berke, Boykin Nearing, MD  Medications Prior to Admission  Medication Sig Dispense Refill Last Dose   levothyroxine (SYNTHROID, LEVOTHROID) 88 MCG tablet levothyroxine 88 mcg tablet   10/20/2020   montelukast (SINGULAIR) 10 MG tablet TAKE 1 TABLET (10 MG TOTAL) DAILY BY MOUTH. 90 tablet 1 Past Week   albuterol (PROAIR HFA) 108 (90 Base) MCG/ACT inhaler Inhale 1-2 puffs into the lungs every 4 (four) hours as needed for wheezing or shortness of breath. 1 Inhaler 10    dimenhyDRINATE (DRAMAMINE) 50 MG tablet Take 50 mg every 8 (eight) hours as needed by mouth.      fluticasone (FLONASE) 50 MCG/ACT nasal spray Place 2 sprays into both nostrils daily.      fluticasone furoate-vilanterol (BREO ELLIPTA) 100-25 MCG/INH AEPB TAKE 1 PUFF BY MOUTH EVERY DAY 180 each 3    omeprazole (PRILOSEC) 20 MG capsule TAKE 1 CAPSULE (20 MG TOTAL) BY MOUTH 2 (TWO) TIMES DAILY BEFORE A MEAL. 180 capsule 1 10/19/2020   pseudoephedrine-guaifenesin (MUCINEX D) 60-600 MG 12 hr tablet Take 1 tablet by mouth every 12 (twelve) hours.      sertraline (ZOLOFT) 100 MG tablet TAKE 1.5 TABLETS (150 MG TOTAL) BY MOUTH EVERY EVENING. 135 tablet 1 10/19/2020     No Known Allergies   Past Medical History:  Diagnosis Date   Allergic rhinitis    Chickenpox    Depression    GERD (gastroesophageal reflux disease)    Stress incontinence    Thyroid disease    UTI (urinary tract infection)     Review of systems:  Otherwise negative.    Physical Exam  Gen:  Alert, oriented. Appears stated age.  HEENT: [place]/AT. PERRLA. Lungs: CTA, no wheezes. CV: RR nl S1, S2. Abd: soft, benign, no masses. BS+ Ext: No edema. Pulses 2+    Planned procedures: Proceed with colonoscopy. The patient understands the nature of the planned procedure, indications, risks, alternatives and potential complications including but not limited to bleeding, infection, perforation, damage to internal organs and possible oversedation/side effects from anesthesia. The patient agrees and gives consent to proceed.  Please refer to procedure notes for findings, recommendations and patient disposition/instructions.     Aileana Hodder K. Norma Fredrickson, M.D. Gastroenterology 10/21/2020  2:17 PM

## 2020-10-22 ENCOUNTER — Encounter: Payer: Self-pay | Admitting: Internal Medicine

## 2021-03-24 ENCOUNTER — Other Ambulatory Visit: Payer: Self-pay | Admitting: Family Medicine

## 2021-03-24 DIAGNOSIS — Z1231 Encounter for screening mammogram for malignant neoplasm of breast: Secondary | ICD-10-CM

## 2021-04-28 ENCOUNTER — Ambulatory Visit
Admission: RE | Admit: 2021-04-28 | Discharge: 2021-04-28 | Disposition: A | Discharge status: Home / Self Care | Payer: Medicare PPO | Source: Ambulatory Visit | Attending: Family Medicine | Admitting: Family Medicine

## 2021-04-28 ENCOUNTER — Other Ambulatory Visit: Payer: Self-pay

## 2021-04-28 DIAGNOSIS — Z1231 Encounter for screening mammogram for malignant neoplasm of breast: Secondary | ICD-10-CM | POA: Diagnosis not present

## 2021-12-18 NOTE — Progress Notes (Signed)
Sent message, via epic in basket, requesting orders in epic from surgeon.  

## 2021-12-23 NOTE — Patient Instructions (Signed)
SURGICAL WAITING ROOM VISITATION Patients having surgery or a procedure may have no more than 2 support people in the waiting area - these visitors may rotate.   Children under the age of 80 must have an adult with them who is not the patient. If the patient needs to stay at the hospital during part of their recovery, the visitor guidelines for inpatient rooms apply. Pre-op nurse will coordinate an appropriate time for 1 support person to accompany patient in pre-op.  This support person may not rotate.    Please refer to the [facility] website for the visitor guidelines for Inpatients (after your surgery is over and you are in a regular room).       Your procedure is scheduled on:  01/06/2022    Report to [facility] Main Entrance    Report to admitting at   1100AM   Call this number if you have problems the morning of surgery [phone]   Do not eat food :After Midnight.   After Midnight you may have the following liquids until ___ 1030___ AM  DAY OF SURGERY  Water Non-Citrus Juices (without pulp, NO RED) Carbonated Beverages Black Coffee (NO MILK/CREAM OR CREAMERS, sugar ok)  Clear Tea (NO MILK/CREAM OR CREAMERS, sugar ok) regular and decaf                             Plain Jell-O (NO RED)                                           Fruit ices (not with fruit pulp, NO RED)                                     Popsicles (NO RED)                                                               Sports drinks like Gatorade (NO RED)         The day of surgery:  Drink ONE (1) Pre-Surgery Clear Ensure or G2 at  1030 AM  ( have completed by ) the morning of surgery. Drink in one sitting. Do not sip.  This drink was given to you during your hospital  pre-op appointment visit. Nothing else to drink after completing the  Pre-Surgery Clear Ensure or G2.          If you have questions, please contact your surgeon's office.        Oral Hygiene is also important to reduce your  risk of infection.                                    Remember - BRUSH YOUR TEETH THE MORNING OF SURGERY WITH YOUR REGULAR TOOTHPASTE   Do NOT smoke after Midnight   Take these medicines the morning of surgery with A SIP OF WATER:  inhalers as usual and bring, synthroid, omeprazole, zoloft   DO NOT TAKE ANY ORAL  DIABETIC MEDICATIONS DAY OF YOUR SURGERY  Bring CPAP mask and tubing day of surgery.                              You may not have any metal on your body including hair pins, jewelry, and body piercing             Do not wear make-up, lotions, powders, perfumes/cologne, or deodorant  Do not wear nail polish including gel and S&S, artificial/acrylic nails, or any other type of covering on natural nails including finger and toenails. If you have artificial nails, gel coating, etc. that needs to be removed by a nail salon please have this removed prior to surgery or surgery may need to be canceled/ delayed if the surgeon/ anesthesia feels like they are unable to be safely monitored.   Do not shave  48 hours prior to surgery.               Men may shave face and neck.   Do not bring valuables to the hospital. [facility] IS NOT             RESPONSIBLE   FOR VALUABLES.   Contacts, dentures or bridgework may not be worn into surgery.   Bring small overnight bag day of surgery.   DO NOT BRING YOUR HOME MEDICATIONS TO THE HOSPITAL. PHARMACY WILL DISPENSE MEDICATIONS LISTED ON YOUR MEDICATION LIST TO YOU DURING YOUR ADMISSION IN THE HOSPITAL!    Patients discharged on the day of surgery will not be allowed to drive home.  Someone NEEDS to stay with you for the first 24 hours after anesthesia.   Special Instructions: Bring a copy of your healthcare power of attorney and living will documents the day of surgery if you haven't scanned them before.              Please read over the following fact sheets you were given: IF YOU HAVE QUESTIONS ABOUT YOUR PRE-OP INSTRUCTIONS PLEASE CALL  [phone]   If you received a COVID test during your pre-op visit  it is requested that you wear a mask when out in public, stay away from anyone that may not be feeling well and notify your surgeon if you develop symptoms. If you test positive for Covid or have been in contact with anyone that has tested positive in the last 10 days please notify you surgeon.     [facility] - Preparing for Surgery Before surgery, you can play an important role.  Because skin is not sterile, your skin needs to be as free of germs as possible.  You can reduce the number of germs on your skin by washing with CHG (chlorahexidine gluconate) soap before surgery.  CHG is an antiseptic cleaner which kills germs and bonds with the skin to continue killing germs even after washing. Please DO NOT use if you have an allergy to CHG or antibacterial soaps.  If your skin becomes reddened/irritated stop using the CHG and inform your nurse when you arrive at Short Stay. Do not shave (including legs and underarms) for at least 48 hours prior to the first CHG shower.  You may shave your face/neck. Please follow these instructions carefully:  1.  Shower with CHG Soap the night before surgery and the  morning of Surgery.  2.  If you choose to wash your hair, wash your hair first as usual with your  normal  shampoo.  3.  After you shampoo, rinse your hair and body thoroughly to remove the  shampoo.                           4.  Use CHG as you would any other liquid soap.  You can apply chg directly  to the skin and wash                       Gently with a scrungie or clean washcloth.  5.  Apply the CHG Soap to your body ONLY FROM THE NECK DOWN.   Do not use on face/ open                           Wound or open sores. Avoid contact with eyes, ears mouth and genitals (private parts).                       Wash face,  Genitals (private parts) with your normal soap.             6.  Wash thoroughly, paying special attention to the area  where your surgery  will be performed.  7.  Thoroughly rinse your body with warm water from the neck down.  8.  DO NOT shower/wash with your normal soap after using and rinsing off  the CHG Soap.                9.  Pat yourself dry with a clean towel.            10.  Wear clean pajamas.            11.  Place clean sheets on your bed the night of your first shower and do not  sleep with pets. Day of Surgery : Do not apply any lotions/deodorants the morning of surgery.  Please wear clean clothes to the hospital/surgery center.  FAILURE TO FOLLOW THESE INSTRUCTIONS MAY RESULT IN THE CANCELLATION OF YOUR SURGERY PATIENT SIGNATURE_________________________________  NURSE SIGNATURE__________________________________  ________________________________________________________________________

## 2021-12-25 ENCOUNTER — Encounter (HOSPITAL_COMMUNITY): Payer: Self-pay

## 2021-12-25 ENCOUNTER — Other Ambulatory Visit: Payer: Self-pay

## 2021-12-25 ENCOUNTER — Encounter (HOSPITAL_COMMUNITY)
Admission: RE | Admit: 2021-12-25 | Discharge: 2021-12-25 | Disposition: A | Discharge status: Home / Self Care | Payer: Medicare PPO | Source: Ambulatory Visit | Attending: Orthopedic Surgery | Admitting: Orthopedic Surgery

## 2021-12-25 VITALS — BP 155/90 | HR 67 | Temp 98.3°F | Resp 16 | Ht 65.5 in | Wt 229.0 lb

## 2021-12-25 DIAGNOSIS — Z01818 Encounter for other preprocedural examination: Secondary | ICD-10-CM | POA: Insufficient documentation

## 2021-12-25 HISTORY — DX: Pneumonia, unspecified organism: J18.9

## 2021-12-25 HISTORY — DX: Unspecified osteoarthritis, unspecified site: M19.90

## 2021-12-25 HISTORY — DX: Anxiety disorder, unspecified: F41.9

## 2021-12-25 HISTORY — DX: Hypothyroidism, unspecified: E03.9

## 2021-12-25 HISTORY — DX: Other specified postprocedural states: Z98.890

## 2021-12-25 LAB — CBC
HCT: 41.4 % (ref 36.0–46.0)
Hemoglobin: 13.3 g/dL (ref 12.0–15.0)
MCH: 29.7 pg (ref 26.0–34.0)
MCHC: 32.1 g/dL (ref 30.0–36.0)
MCV: 92.4 fL (ref 80.0–100.0)
Platelets: 291 10*3/uL (ref 150–400)
RBC: 4.48 MIL/uL (ref 3.87–5.11)
RDW: 14.8 % (ref 11.5–15.5)
WBC: 10.2 10*3/uL (ref 4.0–10.5)
nRBC: 0 % (ref 0.0–0.2)

## 2021-12-25 LAB — SURGICAL PCR SCREEN
MRSA, PCR: NEGATIVE
Staphylococcus aureus: NEGATIVE

## 2021-12-25 NOTE — Progress Notes (Addendum)
Anesthesia Review:  PCP: Diane Walsh clearance 11/26/21 on chart  PT states she has appt for physical on 01/05/2022 at 1100am.  LOV was 07/03/21.  Cardiologist : none  Chest x-ray : EKG : Echo : Stress test: Cardiac Cath :  Activity level: can do a flight of stairs without difficutly  Sleep Study/ CPAP : none  Fasting Blood Sugar :      / Checks Blood Sugar -- times a day:   Blood Thinner/ Instructions /Last Dose: ASA / Instructions/ Last Dose :

## 2022-01-05 NOTE — H&P (Signed)
KNEE ARTHROPLASTY ADMISSION H&P  Patient ID: Diane Walsh MRN: [id number] DOB/AGE: January 03, 1956 66 y.o.  Chief Complaint: left knee pain.  Planned Procedure Date: 01/06/22 Medical Clearance by Dr. Baldemar Lenis    HPI: Diane Walsh is a 66 y.o. female who presents for evaluation of djd left knee. The patient has a history of pain and functional disability in the left knee due to arthritis and has failed non-surgical conservative treatments for greater than 12 weeks to include NSAID's and/or analgesics, corticosteriod injections, viscosupplementation injections, and activity modification.  Onset of symptoms was gradual, starting 7 years ago with gradually worsening course since that time. The patient noted prior procedures on the knee to include  arthroscopy and menisectomy on the left knee.  Patient currently rates pain at 4 out of 10 with activity. Patient has worsening of pain with activity and weight bearing and pain that interferes with activities of daily living.  Patient has evidence of joint space narrowing by imaging studies.  There is no active infection.  Past Medical History:  Diagnosis Date   Allergic rhinitis    Anxiety    Arthritis    Chickenpox    Depression    GERD (gastroesophageal reflux disease)    Hypothyroidism    Pneumonia    PONV (postoperative nausea and vomiting)    Stress incontinence    Thyroid disease    UTI (urinary tract infection)    Past Surgical History:  Procedure Laterality Date   BREAST BIOPSY Right 11/28/2014   stereotactic biopsy - negative   CHOLECYSTECTOMY     COLONOSCOPY WITH PROPOFOL N/A 10/21/2020   Procedure: COLONOSCOPY WITH PROPOFOL;  Surgeon: Toledo, Benay Pike, MD;  Location: [facility] ENDOSCOPY;  Service: Gastroenterology;  Laterality: N/A;   HAND SURGERY Right 02/12/2015   Dr. Amedeo Plenty; [place]     No Known Allergies Prior to Admission medications   Medication Sig Start Date End Date Taking? Authorizing  Provider  albuterol (PROAIR HFA) 108 (90 Base) MCG/ACT inhaler Inhale 1-2 puffs into the lungs every 4 (four) hours as needed for wheezing or shortness of breath. 01/07/18  Yes Wilhelmina Mcardle, MD  celecoxib (CELEBREX) 200 MG capsule Take 200 mg by mouth daily. 09/26/21  Yes [provider]  levothyroxine (SYNTHROID) 100 MCG tablet Take 100 mcg by mouth daily before breakfast. 11/19/17  Yes [provider]  montelukast (SINGULAIR) 10 MG tablet TAKE 1 TABLET (10 MG TOTAL) DAILY BY MOUTH. Patient taking differently: Take 10 mg by mouth daily as needed (allergies). 08/04/18  Yes Leone Haven, MD  neomycin-bacitracin-polymyxin (NEOSPORIN) OINT Apply 1 Application topically as needed for wound care or irritation.   Yes [provider]  omeprazole (PRILOSEC) 40 MG capsule Take 40 mg by mouth daily.   Yes [provider]  sertraline (ZOLOFT) 100 MG tablet TAKE 1.5 TABLETS (150 MG TOTAL) BY MOUTH EVERY EVENING. Patient taking differently: Take 100 mg by mouth every evening. 08/04/18  Yes Leone Haven, MD  fluticasone furoate-vilanterol (BREO ELLIPTA) 100-25 MCG/INH AEPB TAKE 1 PUFF BY MOUTH EVERY DAY Patient not taking: Reported on 12/22/2021 01/07/18   Wilhelmina Mcardle, MD  omeprazole (PRILOSEC) 20 MG capsule TAKE 1 CAPSULE (20 MG TOTAL) BY MOUTH 2 (TWO) TIMES DAILY BEFORE A MEAL. Patient not taking: Reported on 12/22/2021 06/02/17   Leone Haven, MD   Social History   Socioeconomic History   Marital status: Married    Spouse name: Not on file   Number  of children: 2   Years of education: College   Highest education level: Not on file  Occupational History   Occupation: Full Time  Tobacco Use   Smoking status: Never   Smokeless tobacco: Never  Vaping Use   Vaping Use: Never used  Substance and Sexual Activity   Alcohol use: Yes    Comment: Very Rarely   Drug use: No   Sexual activity: Not on file  Other Topics Concern   Not on file  Social  History Narrative   Not on file   Social Determinants of Health   Financial Resource Strain: Not on file  Food Insecurity: Not on file  Transportation Needs: Not on file  Physical Activity: Not on file  Stress: Not on file  Social Connections: Not on file   Family History  Problem Relation Age of Onset   Hypothyroidism Mother    Dementia Mother    Hypertension Father    Diabetes Father    Hypertension Sister    Breast cancer Cousin    Breast cancer Cousin     ROS: Currently denies lightheadedness, dizziness, Fever, chills, CP, SOB.   No personal history of DVT, PE, MI, or CVA. No loose teeth or dentures All other systems have been reviewed and were otherwise currently negative with the exception of those mentioned in the HPI and as above.  Objective: Vitals: Ht: 5\' 5"  Wt: 236 lbs Temp: 97.9 BP: 133/83 Pulse: 72 O2 92% on room air.   Physical Exam: General: Alert, NAD.  Antalgic Gait  HEENT: EOMI, Good Neck Extension  Pulm: No increased work of breathing.  Clear B/L A/P w/o crackle or wheeze.  CV: RRR, No m/g/r appreciated  GI: soft, NT, ND Neuro: Neuro without gross focal deficit.  Sensation intact distally Skin: No lesions in the area of chief complaint MSK/Surgical Site: left knee w/o redness or effusion.  + medial and lateral JLT. ROM 0-90.  5/5 strength in extension and flexion.  +EHL/FHL.  NVI.  Stable varus and valgus stress.    Imaging Review Plain radiographs demonstrate severe degenerative joint disease of the left knee.   The overall alignment ismild varus. The bone quality appears to be adequate for age and reported activity level.  Preoperative templating of the joint replacement has been completed, documented, and submitted to the Operating Room personnel in order to optimize intra-operative equipment management.  Assessment: djd left knee   Plan: Plan for Procedure(s): TOTAL KNEE ARTHROPLASTY  The patient history, physical exam, clinical judgement  of the provider and imaging are consistent with end stage degenerative joint disease and total joint arthroplasty is deemed medically necessary. The treatment options including medical management, injection therapy, and arthroplasty were discussed at length. The risks and benefits of Procedure(s): TOTAL KNEE ARTHROPLASTY were presented and reviewed.  The risks of nonoperative treatment, versus surgical intervention including but not limited to continued pain, aseptic loosening, stiffness, dislocation/subluxation, infection, bleeding, nerve injury, blood clots, cardiopulmonary complications, morbidity, mortality, among others were discussed. The patient verbalizes understanding and wishes to proceed with the plan.  Patient is being admitted for inpatient treatment for surgery, pain control, PT, prophylactic antibiotics, VTE prophylaxis, progressive ambulation, ADL's and discharge planning.   Dental prophylaxis discussed and recommended for 2 years postoperatively.  The patient does meet the criteria for TXA which will be used perioperatively.   ASA 325 mg  will be used postoperatively for DVT prophylaxis in addition to SCDs, and early ambulation. The patient is planning to be  discharged home with OPPT in care of husband   Annita Brod 01/05/2022 5:12 PM

## 2022-01-06 ENCOUNTER — Encounter (HOSPITAL_COMMUNITY): Payer: Self-pay | Admitting: Orthopedic Surgery

## 2022-01-06 ENCOUNTER — Encounter (HOSPITAL_COMMUNITY)
Admission: RE | Disposition: A | Discharge status: Home / Self Care | Payer: Self-pay | Source: Ambulatory Visit | Attending: Orthopedic Surgery

## 2022-01-06 ENCOUNTER — Other Ambulatory Visit: Payer: Self-pay

## 2022-01-06 ENCOUNTER — Ambulatory Visit (HOSPITAL_BASED_OUTPATIENT_CLINIC_OR_DEPARTMENT_OTHER): Payer: Medicare PPO | Admitting: Anesthesiology

## 2022-01-06 ENCOUNTER — Observation Stay (HOSPITAL_COMMUNITY): Payer: Medicare PPO

## 2022-01-06 ENCOUNTER — Observation Stay (HOSPITAL_COMMUNITY)
Admission: RE | Admit: 2022-01-06 | Discharge: 2022-01-07 | Disposition: A | Discharge status: Home / Self Care | Payer: Medicare PPO | Source: Ambulatory Visit | Attending: Orthopedic Surgery | Admitting: Orthopedic Surgery

## 2022-01-06 ENCOUNTER — Ambulatory Visit (HOSPITAL_COMMUNITY): Payer: Medicare PPO | Admitting: Physician Assistant

## 2022-01-06 DIAGNOSIS — E039 Hypothyroidism, unspecified: Secondary | ICD-10-CM | POA: Insufficient documentation

## 2022-01-06 DIAGNOSIS — Z79899 Other long term (current) drug therapy: Secondary | ICD-10-CM | POA: Insufficient documentation

## 2022-01-06 DIAGNOSIS — M1712 Unilateral primary osteoarthritis, left knee: Secondary | ICD-10-CM

## 2022-01-06 DIAGNOSIS — Z01818 Encounter for other preprocedural examination: Secondary | ICD-10-CM

## 2022-01-06 DIAGNOSIS — Z96652 Presence of left artificial knee joint: Secondary | ICD-10-CM

## 2022-01-06 HISTORY — PX: TOTAL KNEE ARTHROPLASTY: SHX125

## 2022-01-06 SURGERY — ARTHROPLASTY, KNEE, TOTAL
Anesthesia: Spinal | Site: Knee | Laterality: Left

## 2022-01-06 MED ORDER — TRANEXAMIC ACID-NACL 1000-0.7 MG/100ML-% IV SOLN
1000.0000 mg | INTRAVENOUS | Status: DC
Start: 2022-01-06 — End: 2022-01-06
  Filled 2022-01-06: qty 100

## 2022-01-06 MED ORDER — FENTANYL CITRATE PF 50 MCG/ML IJ SOSY
PREFILLED_SYRINGE | INTRAMUSCULAR | Status: AC
  Filled 2022-01-06: qty 2

## 2022-01-06 MED ORDER — POVIDONE-IODINE 10 % EX SWAB: 2.0000 | Freq: Once | CUTANEOUS | Status: DC

## 2022-01-06 MED ORDER — MENTHOL 3 MG MT LOZG: 1.0000 | LOZENGE | OROMUCOSAL | Status: DC | PRN

## 2022-01-06 MED ORDER — METHOCARBAMOL 500 MG PO TABS
500.0000 mg | ORAL_TABLET | Freq: Four times a day (QID) | ORAL | Status: DC | PRN
  Administered 2022-01-06 – 2022-01-07 (×2): 500 mg via ORAL
  Filled 2022-01-06 (×2): qty 1

## 2022-01-06 MED ORDER — FENTANYL CITRATE PF 50 MCG/ML IJ SOSY
50.0000 ug | PREFILLED_SYRINGE | INTRAMUSCULAR | Status: DC
  Administered 2022-01-06: 100 ug via INTRAVENOUS

## 2022-01-06 MED ORDER — ASPIRIN 325 MG PO TBEC
325.0000 mg | DELAYED_RELEASE_TABLET | Freq: Every day | ORAL | Status: DC
  Administered 2022-01-07: 325 mg via ORAL
  Filled 2022-01-06: qty 1

## 2022-01-06 MED ORDER — SERTRALINE HCL 50 MG PO TABS
150.0000 mg | ORAL_TABLET | Freq: Every evening | ORAL | Status: DC
  Administered 2022-01-06: 150 mg via ORAL
  Filled 2022-01-06: qty 1

## 2022-01-06 MED ORDER — MIDAZOLAM HCL 2 MG/2ML IJ SOLN
1.0000 mg | INTRAMUSCULAR | Status: DC
  Administered 2022-01-06: 1 mg via INTRAVENOUS

## 2022-01-06 MED ORDER — LACTATED RINGERS IV SOLN: INTRAVENOUS | Status: DC

## 2022-01-06 MED ORDER — EPHEDRINE 5 MG/ML INJ
INTRAVENOUS | Status: AC
  Filled 2022-01-06: qty 5

## 2022-01-06 MED ORDER — MAGNESIUM CITRATE PO SOLN: 1.0000 | Freq: Once | ORAL | Status: DC | PRN

## 2022-01-06 MED ORDER — DEXAMETHASONE SODIUM PHOSPHATE 10 MG/ML IJ SOLN
10.0000 mg | Freq: Once | INTRAMUSCULAR | Status: AC
  Administered 2022-01-07: 10 mg via INTRAVENOUS
  Filled 2022-01-06: qty 1

## 2022-01-06 MED ORDER — CELECOXIB 200 MG PO CAPS
200.0000 mg | ORAL_CAPSULE | Freq: Once | ORAL | Status: AC
  Administered 2022-01-06: 200 mg via ORAL
  Filled 2022-01-06: qty 1

## 2022-01-06 MED ORDER — BUPIVACAINE HCL 0.25 % IJ SOLN
INTRAMUSCULAR | Status: DC | PRN
  Administered 2022-01-06: 30 mL

## 2022-01-06 MED ORDER — PANTOPRAZOLE SODIUM 40 MG PO TBEC
40.0000 mg | DELAYED_RELEASE_TABLET | Freq: Every day | ORAL | Status: DC
  Administered 2022-01-07: 40 mg via ORAL
  Filled 2022-01-06: qty 1

## 2022-01-06 MED ORDER — ACETAMINOPHEN 325 MG PO TABS: 325.0000 mg | ORAL_TABLET | Freq: Four times a day (QID) | ORAL | Status: DC | PRN

## 2022-01-06 MED ORDER — ROPIVACAINE HCL 7.5 MG/ML IJ SOLN
INTRAMUSCULAR | Status: DC | PRN
  Administered 2022-01-06: 20 mL via PERINEURAL

## 2022-01-06 MED ORDER — PROPOFOL 1000 MG/100ML IV EMUL
INTRAVENOUS | Status: AC
  Filled 2022-01-06: qty 100

## 2022-01-06 MED ORDER — CEFAZOLIN SODIUM-DEXTROSE 2-4 GM/100ML-% IV SOLN
2.0000 g | Freq: Four times a day (QID) | INTRAVENOUS | Status: AC
  Administered 2022-01-06 – 2022-01-07 (×2): 2 g via INTRAVENOUS
  Filled 2022-01-06 (×2): qty 100

## 2022-01-06 MED ORDER — HYDROCODONE-ACETAMINOPHEN 5-325 MG PO TABS: 1.0000 | ORAL_TABLET | ORAL | Status: DC | PRN

## 2022-01-06 MED ORDER — POTASSIUM CHLORIDE IN NACL 20-0.9 MEQ/L-% IV SOLN
INTRAVENOUS | Status: DC
  Filled 2022-01-06: qty 1000

## 2022-01-06 MED ORDER — BUPIVACAINE IN DEXTROSE 0.75-8.25 % IT SOLN
INTRATHECAL | Status: DC | PRN
  Administered 2022-01-06: 1.8 mL via INTRATHECAL

## 2022-01-06 MED ORDER — KETOROLAC TROMETHAMINE 30 MG/ML IJ SOLN
INTRAMUSCULAR | Status: DC | PRN
  Administered 2022-01-06: 30 mg via INTRAMUSCULAR

## 2022-01-06 MED ORDER — EPHEDRINE SULFATE-NACL 50-0.9 MG/10ML-% IV SOSY
PREFILLED_SYRINGE | INTRAVENOUS | Status: DC | PRN
  Administered 2022-01-06 (×2): 5 mg via INTRAVENOUS

## 2022-01-06 MED ORDER — PHENOL 1.4 % MT LIQD: 1.0000 | OROMUCOSAL | Status: DC | PRN

## 2022-01-06 MED ORDER — MIDAZOLAM HCL 2 MG/2ML IJ SOLN
INTRAMUSCULAR | Status: AC
  Filled 2022-01-06: qty 2

## 2022-01-06 MED ORDER — BISACODYL 10 MG RE SUPP: 10.0000 mg | Freq: Every day | RECTAL | Status: DC | PRN

## 2022-01-06 MED ORDER — BUPIVACAINE HCL (PF) 0.25 % IJ SOLN
INTRAMUSCULAR | Status: AC
  Filled 2022-01-06: qty 30

## 2022-01-06 MED ORDER — POLYETHYLENE GLYCOL 3350 17 G PO PACK: 17.0000 g | PACK | Freq: Every day | ORAL | Status: DC | PRN

## 2022-01-06 MED ORDER — FLUTICASONE FUROATE-VILANTEROL 100-25 MCG/INH IN AEPB: 1.0000 | INHALATION_SPRAY | Freq: Every day | RESPIRATORY_TRACT | Status: DC

## 2022-01-06 MED ORDER — METOCLOPRAMIDE HCL 5 MG/ML IJ SOLN: 5.0000 mg | Freq: Three times a day (TID) | INTRAMUSCULAR | Status: DC | PRN

## 2022-01-06 MED ORDER — ACETAMINOPHEN 500 MG PO TABS
1000.0000 mg | ORAL_TABLET | Freq: Once | ORAL | Status: AC
  Administered 2022-01-06: 1000 mg via ORAL
  Filled 2022-01-06: qty 2

## 2022-01-06 MED ORDER — ALBUTEROL SULFATE (2.5 MG/3ML) 0.083% IN NEBU: 2.5000 mg | INHALATION_SOLUTION | Freq: Four times a day (QID) | RESPIRATORY_TRACT | Status: DC | PRN

## 2022-01-06 MED ORDER — PROPOFOL 10 MG/ML IV BOLUS
INTRAVENOUS | Status: DC | PRN
  Administered 2022-01-06 (×2): 30 mg via INTRAVENOUS

## 2022-01-06 MED ORDER — DOCUSATE SODIUM 100 MG PO CAPS
100.0000 mg | ORAL_CAPSULE | Freq: Two times a day (BID) | ORAL | Status: DC
  Administered 2022-01-06 – 2022-01-07 (×2): 100 mg via ORAL
  Filled 2022-01-06 (×2): qty 1

## 2022-01-06 MED ORDER — DEXAMETHASONE SODIUM PHOSPHATE 10 MG/ML IJ SOLN
INTRAMUSCULAR | Status: AC
  Filled 2022-01-06: qty 1

## 2022-01-06 MED ORDER — FENTANYL CITRATE PF 50 MCG/ML IJ SOSY: 25.0000 ug | PREFILLED_SYRINGE | INTRAMUSCULAR | Status: DC | PRN

## 2022-01-06 MED ORDER — ORAL CARE MOUTH RINSE: 15.0000 mL | Freq: Once | OROMUCOSAL | Status: AC

## 2022-01-06 MED ORDER — DIPHENHYDRAMINE HCL 12.5 MG/5ML PO ELIX: 12.5000 mg | ORAL_SOLUTION | ORAL | Status: DC | PRN

## 2022-01-06 MED ORDER — ONDANSETRON HCL 4 MG/2ML IJ SOLN: 4.0000 mg | Freq: Four times a day (QID) | INTRAMUSCULAR | Status: DC | PRN

## 2022-01-06 MED ORDER — ACETAMINOPHEN 500 MG PO TABS
500.0000 mg | ORAL_TABLET | Freq: Four times a day (QID) | ORAL | Status: DC
  Administered 2022-01-06 – 2022-01-07 (×2): 500 mg via ORAL
  Filled 2022-01-06 (×3): qty 1

## 2022-01-06 MED ORDER — ONDANSETRON HCL 4 MG PO TABS: 4.0000 mg | ORAL_TABLET | Freq: Four times a day (QID) | ORAL | Status: DC | PRN

## 2022-01-06 MED ORDER — METHOCARBAMOL 1000 MG/10ML IJ SOLN: 500.0000 mg | Freq: Four times a day (QID) | INTRAVENOUS | Status: DC | PRN

## 2022-01-06 MED ORDER — ACETAMINOPHEN 500 MG PO TABS: 1000.0000 mg | ORAL_TABLET | Freq: Once | ORAL | Status: DC

## 2022-01-06 MED ORDER — ALBUTEROL SULFATE HFA 108 (90 BASE) MCG/ACT IN AERS: 1.0000 | INHALATION_SPRAY | RESPIRATORY_TRACT | Status: DC | PRN

## 2022-01-06 MED ORDER — CEFAZOLIN SODIUM-DEXTROSE 2-4 GM/100ML-% IV SOLN
2.0000 g | INTRAVENOUS | Status: AC
  Administered 2022-01-06: 2 g via INTRAVENOUS
  Filled 2022-01-06: qty 100

## 2022-01-06 MED ORDER — DEXAMETHASONE SODIUM PHOSPHATE 10 MG/ML IJ SOLN
INTRAMUSCULAR | Status: DC | PRN
  Administered 2022-01-06: 5 mg

## 2022-01-06 MED ORDER — ONDANSETRON HCL 4 MG/2ML IJ SOLN
INTRAMUSCULAR | Status: DC | PRN
  Administered 2022-01-06: 4 mg via INTRAVENOUS

## 2022-01-06 MED ORDER — MORPHINE SULFATE (PF) 2 MG/ML IV SOLN
0.5000 mg | INTRAVENOUS | Status: DC | PRN
  Administered 2022-01-07: 1 mg via INTRAVENOUS
  Filled 2022-01-06: qty 1

## 2022-01-06 MED ORDER — PROMETHAZINE HCL 25 MG/ML IJ SOLN: 6.2500 mg | INTRAMUSCULAR | Status: DC | PRN

## 2022-01-06 MED ORDER — POVIDONE-IODINE 10 % EX SWAB
2.0000 | Freq: Once | CUTANEOUS | Status: AC
  Administered 2022-01-06: 2 via TOPICAL

## 2022-01-06 MED ORDER — 0.9 % SODIUM CHLORIDE (POUR BTL) OPTIME
TOPICAL | Status: DC | PRN
  Administered 2022-01-06: 1000 mL

## 2022-01-06 MED ORDER — TRAMADOL HCL 50 MG PO TABS
50.0000 mg | ORAL_TABLET | ORAL | Status: DC | PRN
  Administered 2022-01-07 (×2): 50 mg via ORAL
  Filled 2022-01-06 (×2): qty 1

## 2022-01-06 MED ORDER — CHLORHEXIDINE GLUCONATE 0.12 % MT SOLN
15.0000 mL | Freq: Once | OROMUCOSAL | Status: AC
  Administered 2022-01-06: 15 mL via OROMUCOSAL

## 2022-01-06 MED ORDER — SODIUM CHLORIDE 0.9 % IR SOLN
Status: DC | PRN
  Administered 2022-01-06: 1000 mL

## 2022-01-06 MED ORDER — LEVOTHYROXINE SODIUM 100 MCG PO TABS
100.0000 ug | ORAL_TABLET | Freq: Every day | ORAL | Status: DC
  Administered 2022-01-07: 100 ug via ORAL
  Filled 2022-01-06: qty 1

## 2022-01-06 MED ORDER — TRANEXAMIC ACID-NACL 1000-0.7 MG/100ML-% IV SOLN
1000.0000 mg | Freq: Once | INTRAVENOUS | Status: AC
  Administered 2022-01-06: 1000 mg via INTRAVENOUS
  Filled 2022-01-06: qty 100

## 2022-01-06 MED ORDER — FENTANYL CITRATE (PF) 250 MCG/5ML IJ SOLN
INTRAMUSCULAR | Status: DC | PRN
  Administered 2022-01-06: 50 ug via INTRAVENOUS

## 2022-01-06 MED ORDER — ONDANSETRON HCL 4 MG/2ML IJ SOLN
INTRAMUSCULAR | Status: AC
  Filled 2022-01-06: qty 2

## 2022-01-06 MED ORDER — METOCLOPRAMIDE HCL 5 MG PO TABS: 5.0000 mg | ORAL_TABLET | Freq: Three times a day (TID) | ORAL | Status: DC | PRN

## 2022-01-06 MED ORDER — FENTANYL CITRATE (PF) 100 MCG/2ML IJ SOLN
INTRAMUSCULAR | Status: AC
  Filled 2022-01-06: qty 2

## 2022-01-06 MED ORDER — BUPIVACAINE HCL 0.25 % IJ SOLN
INTRAMUSCULAR | Status: AC
  Filled 2022-01-06: qty 1

## 2022-01-06 MED ORDER — PROPOFOL 500 MG/50ML IV EMUL
INTRAVENOUS | Status: DC | PRN
  Administered 2022-01-06: 90 ug/kg/min via INTRAVENOUS
  Administered 2022-01-06: 100 ug/kg/min via INTRAVENOUS

## 2022-01-06 MED ORDER — WATER FOR IRRIGATION, STERILE IR SOLN
Status: DC | PRN
  Administered 2022-01-06: 2000

## 2022-01-06 MED ORDER — HYDROCODONE-ACETAMINOPHEN 7.5-325 MG PO TABS
1.0000 | ORAL_TABLET | ORAL | Status: DC | PRN
  Administered 2022-01-06 – 2022-01-07 (×5): 2 via ORAL
  Filled 2022-01-06 (×5): qty 2

## 2022-01-06 MED ORDER — KETOROLAC TROMETHAMINE 30 MG/ML IJ SOLN
INTRAMUSCULAR | Status: AC
  Filled 2022-01-06: qty 1

## 2022-01-06 MED ORDER — POVIDONE-IODINE 7.5 % EX SOLN: Freq: Once | CUTANEOUS | Status: DC

## 2022-01-06 MED ORDER — ALUM & MAG HYDROXIDE-SIMETH 200-200-20 MG/5ML PO SUSP: 30.0000 mL | ORAL | Status: DC | PRN

## 2022-01-06 MED ORDER — DEXAMETHASONE SODIUM PHOSPHATE 10 MG/ML IJ SOLN
INTRAMUSCULAR | Status: DC | PRN
  Administered 2022-01-06: 10 mg via INTRAVENOUS

## 2022-01-06 MED ORDER — AMISULPRIDE (ANTIEMETIC) 5 MG/2ML IV SOLN: 10.0000 mg | Freq: Once | INTRAVENOUS | Status: DC | PRN

## 2022-01-06 SURGICAL SUPPLY — 50 items
ATTUNE PSFEM LTSZ6 NARCEM KNEE (Femur) IMPLANT
BAG COUNTER SPONGE SURGICOUNT (BAG) IMPLANT
BASEPLATE TIB CMT FB PCKT SZ6 (Knees) IMPLANT
BLADE SAG 18X100X1.27 (BLADE) ×1 IMPLANT
BLADE SAW SGTL 11.0X1.19X90.0M (BLADE) ×1 IMPLANT
BLADE SAW SGTL 13X75X1.27 (BLADE) ×1 IMPLANT
BLADE SURG 15 STRL LF DISP TIS (BLADE) ×1 IMPLANT
BLADE SURG 15 STRL SS (BLADE) ×1
BNDG ELASTIC 6X10 VLCR STRL LF (GAUZE/BANDAGES/DRESSINGS) ×1 IMPLANT
BOWL SMART MIX CTS (DISPOSABLE) ×1 IMPLANT
CEMENT HV SMART SET (Cement) ×2 IMPLANT
CLSR STERI-STRIP ANTIMIC 1/2X4 (GAUZE/BANDAGES/DRESSINGS) ×2 IMPLANT
COVER SURGICAL LIGHT HANDLE (MISCELLANEOUS) ×1 IMPLANT
CUFF TOURN SGL QUICK 34 (TOURNIQUET CUFF) ×1
CUFF TRNQT CYL 34X4.125X (TOURNIQUET CUFF) ×1 IMPLANT
DRAPE SHEET LG 3/4 BI-LAMINATE (DRAPES) ×1 IMPLANT
DRAPE U-SHAPE 47X51 STRL (DRAPES) ×1 IMPLANT
DRSG AQUACEL AG ADV 3.5X10 (GAUZE/BANDAGES/DRESSINGS) IMPLANT
DRSG MEPILEX POST OP 4X12 (GAUZE/BANDAGES/DRESSINGS) ×1 IMPLANT
DURAPREP 26ML APPLICATOR (WOUND CARE) ×2 IMPLANT
ELECT REM PT RETURN 15FT ADLT (MISCELLANEOUS) ×1 IMPLANT
GAUZE PAD ABD 8X10 STRL (GAUZE/BANDAGES/DRESSINGS) ×2 IMPLANT
GLOVE BIO SURGEON STRL SZ 6.5 (GLOVE) ×1 IMPLANT
GLOVE BIO SURGEON STRL SZ7.5 (GLOVE) ×1 IMPLANT
GLOVE BIOGEL PI IND STRL 7.0 (GLOVE) ×1 IMPLANT
GLOVE BIOGEL PI IND STRL 8 (GLOVE) ×1 IMPLANT
GOWN STRL SURGICAL XL XLNG (GOWN DISPOSABLE) ×2 IMPLANT
HANDPIECE INTERPULSE COAX TIP (DISPOSABLE) ×1
HOLDER FOLEY CATH W/STRAP (MISCELLANEOUS) IMPLANT
IMMOBILIZER KNEE 20 (SOFTGOODS) ×1
IMMOBILIZER KNEE 20 THIGH 36 (SOFTGOODS) ×1 IMPLANT
INSERT TIB PS FB ATTUNE SZ6X5 (Knees) IMPLANT
KIT TURNOVER KIT A (KITS) IMPLANT
MANIFOLD NEPTUNE II (INSTRUMENTS) ×1 IMPLANT
NS IRRIG 1000ML POUR BTL (IV SOLUTION) ×1 IMPLANT
PACK TOTAL KNEE CUSTOM (KITS) ×1 IMPLANT
PATELLA MEDIAL ATTUN 35MM KNEE (Knees) IMPLANT
PROTECTOR NERVE ULNAR (MISCELLANEOUS) ×1 IMPLANT
SET HNDPC FAN SPRY TIP SCT (DISPOSABLE) ×1 IMPLANT
SET PAD KNEE POSITIONER (MISCELLANEOUS) ×1 IMPLANT
SUT VIC AB 1 CT1 36 (SUTURE) ×2 IMPLANT
SUT VIC AB 2-0 CT1 27 (SUTURE) ×1
SUT VIC AB 2-0 CT1 TAPERPNT 27 (SUTURE) ×1 IMPLANT
SUT VIC AB 3-0 SH 27 (SUTURE)
SUT VIC AB 3-0 SH 27X BRD (SUTURE) IMPLANT
TAPE STRIPS DRAPE STRL (GAUZE/BANDAGES/DRESSINGS) IMPLANT
TRAY FOLEY MTR SLVR 14FR STAT (SET/KITS/TRAYS/PACK) IMPLANT
TUBE SUCTION HIGH CAP CLEAR NV (SUCTIONS) ×1 IMPLANT
WATER STERILE IRR 1000ML POUR (IV SOLUTION) ×2 IMPLANT
WRAP KNEE MAXI GEL POST OP (GAUZE/BANDAGES/DRESSINGS) ×1 IMPLANT

## 2022-01-06 NOTE — Anesthesia Postprocedure Evaluation (Signed)
Anesthesia Post Note  Patient: Diane Walsh  Procedure(s) Performed: TOTAL KNEE ARTHROPLASTY (Left: Knee)     Patient location during evaluation: PACU Anesthesia Type: Spinal Level of consciousness: awake and alert Pain management: pain level controlled Vital Signs Assessment: post-procedure vital signs reviewed and stable Respiratory status: spontaneous breathing and respiratory function stable Cardiovascular status: blood pressure returned to baseline and stable Postop Assessment: spinal receding Anesthetic complications: no  No notable events documented.  Last Vitals:  Vitals:   01/06/22 1800 01/06/22 1818  BP: 134/81 122/70  Pulse: 68 71  Resp: 20   Temp:  36.9 C  SpO2: 99% 96%    Last Pain:  Vitals:   01/06/22 1818  TempSrc: Oral  PainSc:                  [name] DANIEL

## 2022-01-06 NOTE — Interval H&P Note (Signed)
History and Physical Interval Note:  01/06/2022 11:59 AM  [name]  has presented today for surgery, with the diagnosis of djd left knee.  The various methods of treatment have been discussed with the patient and family. After consideration of risks, benefits and other options for treatment, the patient has consented to  Procedure(s): TOTAL KNEE ARTHROPLASTY (Left) as a surgical intervention.  The patient's history has been reviewed, patient examined, no change in status, stable for surgery.  I have reviewed the patient's chart and labs.  Questions were answered to the patient's satisfaction.     Diane Walsh

## 2022-01-06 NOTE — Anesthesia Procedure Notes (Signed)
Anesthesia Regional Block: Adductor canal block   Pre-Anesthetic Checklist: , timeout performed,  Correct Patient, Correct Site, Correct Laterality,  Correct Procedure, Correct Position, site marked,  Risks and benefits discussed,  Surgical consent,  Pre-op evaluation,  At surgeon's request and post-op pain management  Laterality: Left  Prep: chloraprep       Needles:  Injection technique: Single-shot  Needle Type: Stimulator Needle - 80     Needle Length: 10cm  Needle Gauge: 21     Additional Needles:   Narrative:  Start time: 01/06/2022 1:33 PM End time: 01/06/2022 1:44 PM Injection made incrementally with aspirations every 5 mL.  Performed by: Personally  Anesthesiologist: Duane Boston, MD

## 2022-01-06 NOTE — Op Note (Signed)
DATE OF SURGERY:  01/06/2022 TIME: 4:51 PM  PATIENT NAME:  Diane Walsh   AGE: 66 y.o.    PRE-OPERATIVE DIAGNOSIS: eft knee primary localized osteoarthritis  POST-OPERATIVE DIAGNOSIS:  Same  PROCEDURE: Left total Knee Arthroplasty  SURGEON:  Johnny Bridge, MD   ASSISTANT:  Merlene Pulling, PA-C, present and scrubbed throughout the case, critical for assistance with exposure, retraction, instrumentation, and closure.   OPERATIVE IMPLANTS: Depuy Attune size 6 narrow posterior Stabilized Femur, with a size 6 fixed Bearing Tibia, 5 polyethylene insert with a 35 medialized oval dome polyethylene patella.  PREOPERATIVE INDICATIONS:  Diane Walsh is a 66 y.o. year old female with end stage bone on bone degenerative arthritis of the knee who failed conservative treatment, including injections, antiinflammatories, activity modification, and assistive devices, and had significant impairment of their activities of daily living, and elected for Total Knee Arthroplasty.   The risks, benefits, and alternatives were discussed at length including but not limited to the risks of infection, bleeding, nerve injury, stiffness, blood clots, the need for revision surgery, cardiopulmonary complications, among others, and they were willing to proceed.  OPERATIVE FINDINGS AND UNIQUE ASPECTS OF THE CASE: I cut the tibia twice, the flexion and extension gap was a little bit tight after I had the trials in, I could not get the polyethylene trial in, so after cutting the tibia twice a corrected the problem and I had good balance.  The medial side had severe where there was some moderate grade 3 changes on the lateral side the patellofemoral joint was not bad.  ESTIMATED BLOOD LOSS: 150 mL  OPERATIVE DESCRIPTION:  The patient was brought to the operative room and placed in a supine position.  Anesthesia was administered.  IV antibiotics were given.  The lower extremity was prepped and draped in the usual sterile  fashion.  Time out was performed.  The leg was elevated and exsanguinated and the tourniquet was inflated.  Anterior quadriceps tendon splitting approach was performed.  The patella was everted and osteophytes were removed.  The anterior horn of the medial and lateral meniscus was removed.   The patella was then measured, and cut with the saw.  The thickness before the cut was 22 and after the cut was 15.  A metal shield was used to protect the patella throughout the case.    The distal femur was opened with the drill and the intramedullary distal femoral cutting jig was utilized, set at 5 degrees resecting 9 mm off the distal femur.  Care was taken to protect the collateral ligaments.  Then the extramedullary tibial cutting jig was utilized making the appropriate cut using the anterior tibial crest as a reference building in appropriate posterior slope.  Care was taken during the cut to protect the medial and collateral ligaments.  The proximal tibia was removed along with the posterior horns of the menisci.  The PCL was sacrificed.    The extensor gap was measured and found to have adequate resection, measuring to a size tight 5.    The distal femoral sizing jig was applied, taking care to avoid notching.  This was set at 3 degrees of external rotation.  Then the 4-in-1 cutting jig was applied and the anterior and posterior femur was cut, along with the chamfer cuts.  All posterior osteophytes were removed.  The flexion gap was then measured and was symmetric with the extension gap.  I completed the distal femoral preparation using the appropriate jig to  prepare the box.  The proximal tibia sized and prepared accordingly with the reamer and the punch, and then all components were trialed with the poly insert.  Initially it was too tight to accommodate the trial so I went back and cut the tibia again, and this provided excellent balance.  The knee was found to have excellent balance and full motion.     The above named components were then cemented into place and all excess cement was removed.  The real polyethylene implant was placed.  After the cement had cured I released the tourniquet and confirmed excellent hemostasis with no major posterior vessel injury.    The knee was easily taken through a range of motion and the patella tracked well and the knee irrigated copiously and the parapatellar and subcutaneous tissue closed with vicryl, and monocryl with steri strips for the skin.  The wounds were injected with marcaine, and dressed with sterile gauze and the patient was awakened and returned to the PACU in stable and satisfactory condition.  There were no complications.  Total tourniquet time was 90 minutes.

## 2022-01-06 NOTE — Transfer of Care (Signed)
Immediate Anesthesia Transfer of Care Note  Patient: Diane Walsh  Procedure(s) Performed: Procedure(s): TOTAL KNEE ARTHROPLASTY (Left)  Patient Location: PACU  Anesthesia Type:Spinal  Level of Consciousness: awake, alert  and oriented  Airway & Oxygen Therapy: Patient Spontanous Breathing  Post-op Assessment: Report given to RN and Post -op Vital signs reviewed and stable  Post vital signs: Reviewed and stable  Last Vitals:  Vitals:   01/06/22 1351 01/06/22 1401  BP: 125/77 127/77  Pulse: 65 63  Resp: 14 12  Temp:    SpO2: 02% 77%    Complications: No apparent anesthesia complications

## 2022-01-06 NOTE — Anesthesia Procedure Notes (Signed)
Procedure Name: MAC Date/Time: 01/06/2022 2:49 PM  Performed by: Jenne Campus, CRNAPre-anesthesia Checklist: Patient identified, Emergency Drugs available, Suction available and Patient being monitored Oxygen Delivery Method: Simple face mask

## 2022-01-06 NOTE — Anesthesia Preprocedure Evaluation (Addendum)
Anesthesia Evaluation  Patient identified by MRN, date of birth, ID band Patient awake    Reviewed: Allergy & Precautions, NPO status , Patient's Chart, lab work & pertinent test results  History of Anesthesia Complications (+) PONV and history of anesthetic complications  Airway Mallampati: II  TM Distance: >3 FB Neck ROM: Full    Dental no notable dental hx. (+) Dental Advisory Given   Pulmonary neg pulmonary ROS   Pulmonary exam normal        Cardiovascular negative cardio ROS Normal cardiovascular exam     Neuro/Psych  PSYCHIATRIC DISORDERS Anxiety Depression    negative neurological ROS     GI/Hepatic Neg liver ROS,GERD  Medicated and Controlled,,  Endo/Other  Hypothyroidism    Renal/GU negative Renal ROS     Musculoskeletal negative musculoskeletal ROS (+)    Abdominal   Peds  Hematology negative hematology ROS (+)   Anesthesia Other Findings   Reproductive/Obstetrics                             Anesthesia Physical Anesthesia Plan  ASA: 2  Anesthesia Plan: Spinal   Post-op Pain Management: Regional block*, Tylenol PO (pre-op)* and Celebrex PO (pre-op)*   Induction:   PONV Risk Score and Plan: 3 and Ondansetron, Dexamethasone, Midazolam and Propofol infusion  Airway Management Planned: Natural Airway  Additional Equipment:   Intra-op Plan:   Post-operative Plan:   Informed Consent: I have reviewed the patients History and Physical, chart, labs and discussed the procedure including the risks, benefits and alternatives for the proposed anesthesia with the patient or authorized representative who has indicated his/her understanding and acceptance.     Dental advisory given  Plan Discussed with: Anesthesiologist and CRNA  Anesthesia Plan Comments:        Anesthesia Quick Evaluation

## 2022-01-06 NOTE — Anesthesia Procedure Notes (Signed)
Spinal  Patient location during procedure: OR Start time: 01/06/2022 2:37 PM End time: 01/06/2022 2:47 PM Reason for block: surgical anesthesia Staffing Performed: anesthesiologist  Anesthesiologist: Duane Boston, MD Performed by: Duane Boston, MD Authorized by: Duane Boston, MD   Preanesthetic Checklist Completed: patient identified, IV checked, risks and benefits discussed, surgical consent, monitors and equipment checked, pre-op evaluation and timeout performed Spinal Block Patient position: sitting Prep: DuraPrep Patient monitoring: cardiac monitor, continuous pulse ox and blood pressure Approach: midline Location: L2-3 Injection technique: single-shot Needle Needle type: Pencan  Needle gauge: 24 G Needle length: 9 cm Assessment Events: CSF return Additional Notes Functioning IV was confirmed and monitors were applied. Sterile prep and drape, including hand hygiene and sterile gloves were used. The patient was positioned and the spine was prepped. The skin was anesthetized with lidocaine.  Free flow of clear CSF was obtained prior to injecting local anesthetic into the CSF.  The spinal needle aspirated freely following injection.  The needle was carefully withdrawn.  The patient tolerated the procedure well.

## 2022-01-06 NOTE — Discharge Instructions (Signed)

## 2022-01-07 ENCOUNTER — Encounter (HOSPITAL_COMMUNITY): Payer: Self-pay | Admitting: Orthopedic Surgery

## 2022-01-07 ENCOUNTER — Other Ambulatory Visit: Payer: Self-pay

## 2022-01-07 DIAGNOSIS — M1712 Unilateral primary osteoarthritis, left knee: Secondary | ICD-10-CM | POA: Diagnosis not present

## 2022-01-07 LAB — CBC
HCT: 35.4 % — ABNORMAL LOW (ref 36.0–46.0)
Hemoglobin: 11.4 g/dL — ABNORMAL LOW (ref 12.0–15.0)
MCH: 29.5 pg (ref 26.0–34.0)
MCHC: 32.2 g/dL (ref 30.0–36.0)
MCV: 91.5 fL (ref 80.0–100.0)
Platelets: 253 10*3/uL (ref 150–400)
RBC: 3.87 MIL/uL (ref 3.87–5.11)
RDW: 14.5 % (ref 11.5–15.5)
WBC: 18 10*3/uL — ABNORMAL HIGH (ref 4.0–10.5)
nRBC: 0 % (ref 0.0–0.2)

## 2022-01-07 LAB — BASIC METABOLIC PANEL
Anion gap: 8 (ref 5–15)
BUN: 11 mg/dL (ref 8–23)
CO2: 25 mmol/L (ref 22–32)
Calcium: 9 mg/dL (ref 8.9–10.3)
Chloride: 107 mmol/L (ref 98–111)
Creatinine, Ser: 0.94 mg/dL (ref 0.44–1.00)
GFR, Estimated: >60 mL/min (ref >60)
Glucose, Bld: 171 mg/dL — ABNORMAL HIGH (ref 70–99)
Potassium: 4.6 mmol/L (ref 3.5–5.1)
Sodium: 140 mmol/L (ref 135–145)

## 2022-01-07 MED ORDER — HYDROCODONE-ACETAMINOPHEN 10-325 MG PO TABS: 1.0000 | ORAL_TABLET | ORAL | 0 refills | Status: DC | PRN

## 2022-01-07 MED ORDER — BACLOFEN 10 MG PO TABS: 10.0000 mg | ORAL_TABLET | Freq: Three times a day (TID) | ORAL | 0 refills | Status: DC

## 2022-01-07 MED ORDER — SENNA-DOCUSATE SODIUM 8.6-50 MG PO TABS: 2.0000 | ORAL_TABLET | Freq: Every day | ORAL | 1 refills | Status: DC

## 2022-01-07 MED ORDER — ASPIRIN 325 MG PO TBEC: 325.0000 mg | DELAYED_RELEASE_TABLET | Freq: Two times a day (BID) | ORAL | 0 refills | Status: DC

## 2022-01-07 MED ORDER — ONDANSETRON HCL 4 MG PO TABS: 4.0000 mg | ORAL_TABLET | Freq: Three times a day (TID) | ORAL | 0 refills | Status: DC | PRN

## 2022-01-07 MED FILL — Water For Irrigation, Sterile Irrigation Soln: Qty: 2000 | Status: AC

## 2022-01-07 NOTE — Progress Notes (Signed)
     Subjective: 1 Day Post-Op s/p Procedure(s): TOTAL KNEE ARTHROPLASTY   Patient is alert, oriented. Patient reports pain as well controlled.  Denies chest pain, SOB, Calf pain. No nausea/vomiting. No other complaints.    Objective:  PE: VITALS:   Vitals:   01/06/22 2006 01/06/22 2153 01/07/22 0042 01/07/22 0646  BP: (!) 144/77 (!) 144/67 125/68 (!) 105/58  Pulse: 71 79 74 69  Resp: 15 16 16 18   Temp: 98.2 F (36.8 C) 98.5 F (36.9 C) 98.8 F (37.1 C) 98.8 F (37.1 C)  TempSrc: Oral Oral Oral Oral  SpO2: 97% 93% 95% 94%  Weight:      Height:        ABD soft Sensation intact distally Intact pulses distally Dorsiflexion/Plantar flexion intact Incision: dressing C/D/I  LABS  Results for orders placed or performed during the hospital encounter of 01/06/22 (from the past 24 hour(s))  CBC     Status: Abnormal   Collection Time: 01/07/22  4:18 AM  Result Value Ref Range   WBC 18.0 (H) 4.0 - 10.5 K/uL   RBC 3.87 3.87 - 5.11 MIL/uL   Hemoglobin 11.4 (L) 12.0 - 15.0 g/dL   HCT 13/08/23 (L) 48.5 - 46.2 %   MCV 91.5 80.0 - 100.0 fL   MCH 29.5 26.0 - 34.0 pg   MCHC 32.2 30.0 - 36.0 g/dL   RDW 70.3 50.0 - 93.8 %   Platelets 253 150 - 400 K/uL   nRBC 0.0 0.0 - 0.2 %  Basic metabolic panel     Status: Abnormal   Collection Time: 01/07/22  4:18 AM  Result Value Ref Range   Sodium 140 135 - 145 mmol/L   Potassium 4.6 3.5 - 5.1 mmol/L   Chloride 107 98 - 111 mmol/L   CO2 25 22 - 32 mmol/L   Glucose, Bld 171 (H) 70 - 99 mg/dL   BUN 11 8 - 23 mg/dL   Creatinine, Ser 13/08/23 0.44 - 1.00 mg/dL   Calcium 9.0 8.9 - 9.93 mg/dL   GFR, Estimated 71.6 >96 mL/min   Anion gap 8 5 - 15    DG Knee Left Port  Result Date: 01/06/2022 CLINICAL DATA:  Postop knee arthroplasty. EXAM: PORTABLE LEFT KNEE - 1-2 VIEW COMPARISON:  None Available. FINDINGS: Left knee arthroplasty in expected alignment. No periprosthetic lucency or fracture. There has been patellar resurfacing. Recent postsurgical  change includes air and edema in the soft tissues and joint space. IMPRESSION: Left knee arthroplasty without immediate postoperative complication. Electronically Signed   By: 13/08/2021 M.D.   On: 01/06/2022 18:33    Assessment/Plan: Principal Problem:   S/P TKR (total knee replacement), left   1 Day Post-Op s/p Procedure(s): TOTAL KNEE ARTHROPLASTY  Weightbearing: WBAT LLE, up with therapy Insicional and dressing care: Reinforce dressings as needed VTE prophylaxis: Aspirin 325mg  BID x 30 days Pain control: continue current regimen, pain so far well controlled this morning Follow - up plan: 2 weeks with Dr. 13/08/2021 Dispo: pending PT eval and stair training today, hopeful to discharge home this afternoon  Contact information:   , PA-C Weekdays 8-5  After hours and holidays please check [email or web address] for group call information for [facility]  Janine Ores 01/07/2022, 7:19 AM

## 2022-01-07 NOTE — TOC Transition Note (Signed)
Transition of Care [facility]) - CM/SW Discharge Note   Patient Details  Name: Diane Walsh MRN: [id number] Date of Birth: 04-17-1955  Transition of Care [facility]) CM/SW Contact:  Lennart Pall, LCSW Phone Number: 01/07/2022, 11:05 AM   Clinical Narrative:     Met with pt and confirming she has needed DME at home.  OPPT already arranged in [place] at [facility].  No TOC needs.  Final next level of care: OP Rehab Barriers to Discharge: No Barriers Identified   Patient Goals and CMS Choice Patient states their goals for this hospitalization and ongoing recovery are:: return home      Discharge Placement                       Discharge Plan and Services                DME Arranged: N/A DME Agency: NA                  Social Determinants of Health (SDOH) Interventions     Readmission Risk Interventions     No data to display

## 2022-01-07 NOTE — Discharge Summary (Signed)
Discharge Summary  Patient ID: Diane Walsh MRN: [id number] DOB/AGE: 68-Nov-1957 68 y.o.  Admit date: 01/06/2022 Discharge date: 01/07/2022  Admission Diagnoses:  S/P TKR (total knee replacement), left  Discharge Diagnoses:  Principal Problem:   S/P TKR (total knee replacement), left   Past Medical History:  Diagnosis Date   Allergic rhinitis    Anxiety    Arthritis    Chickenpox    Depression    GERD (gastroesophageal reflux disease)    Hypothyroidism    Pneumonia    PONV (postoperative nausea and vomiting)    Stress incontinence    Thyroid disease    UTI (urinary tract infection)     Surgeries: Procedure(s): TOTAL KNEE ARTHROPLASTY on 01/06/2022   Consultants (if any):   Discharged Condition: Improved  Hospital Course: Diane Walsh is an 66 y.o. female who was admitted 01/06/2022 with a diagnosis of S/P TKR (total knee replacement), left and went to the operating room on 01/06/2022 and underwent the above named procedures.    She was given perioperative antibiotics:  Anti-infectives (From admission, onward)    Start     Dose/Rate Route Frequency Ordered Stop   01/06/22 2100  ceFAZolin (ANCEF) IVPB 2g/100 mL premix        2 g 200 mL/hr over 30 Minutes Intravenous Every 6 hours 01/06/22 1826 01/07/22 0348   01/06/22 1130  ceFAZolin (ANCEF) IVPB 2g/100 mL premix        2 g 200 mL/hr over 30 Minutes Intravenous On call to O.R. 01/06/22 1115 01/06/22 1451     .  She was given sequential compression devices, early ambulation, and aspirin for DVT prophylaxis.  She benefited maximally from the hospital stay and there were no complications.    Recent vital signs:  Vitals:   01/07/22 0936 01/07/22 1359  BP: (!) 144/73 125/75  Pulse: 66 67  Resp: 17 18  Temp: 98 F (36.7 C) 97.8 F (36.6 C)  SpO2: 92% 93%    Recent laboratory studies:  Lab Results  Component Value Date   HGB 11.4 (L) 01/07/2022   HGB 13.3 12/25/2021   HGB 13.0 11/23/2016   Lab  Results  Component Value Date   WBC 18.0 (H) 01/07/2022   PLT 253 01/07/2022   Lab Results  Component Value Date   INR 1.10 08/07/2016   Lab Results  Component Value Date   NA 140 01/07/2022   K 4.6 01/07/2022   CL 107 01/07/2022   CO2 25 01/07/2022   BUN 11 01/07/2022   CREATININE 0.94 01/07/2022   GLUCOSE 171 (H) 01/07/2022    Discharge Medications:   Allergies as of 01/07/2022   Not on File      Medication List     TAKE these medications    albuterol 108 (90 Base) MCG/ACT inhaler Commonly known as: ProAir HFA Inhale 1-2 puffs into the lungs every 4 (four) hours as needed for wheezing or shortness of breath.   aspirin EC 325 MG tablet Take 1 tablet (325 mg total) by mouth 2 (two) times daily.   baclofen 10 MG tablet Commonly known as: LIORESAL Take 1 tablet (10 mg total) by mouth 3 (three) times daily. As needed for muscle spasm   celecoxib 200 MG capsule Commonly known as: CELEBREX Take 200 mg by mouth daily.   fluticasone furoate-vilanterol 100-25 MCG/INH Aepb Commonly known as: Breo Ellipta TAKE 1 PUFF BY MOUTH EVERY DAY   HYDROcodone-acetaminophen 10-325 MG tablet Commonly known as: Norco Take 1  tablet by mouth every 4 (four) hours as needed for severe pain.   levothyroxine 100 MCG tablet Commonly known as: SYNTHROID Take 100 mcg by mouth daily before breakfast.   montelukast 10 MG tablet Commonly known as: SINGULAIR TAKE 1 TABLET (10 MG TOTAL) DAILY BY MOUTH. What changed:  when to take this reasons to take this   neomycin-bacitracin-polymyxin Oint Commonly known as: NEOSPORIN Apply 1 Application topically as needed for wound care or irritation.   omeprazole 40 MG capsule Commonly known as: PRILOSEC Take 40 mg by mouth daily. What changed: Another medication with the same name was removed. Continue taking this medication, and follow the directions you see here.   ondansetron 4 MG tablet Commonly known as: Zofran Take 1 tablet (4 mg  total) by mouth every 8 (eight) hours as needed for nausea or vomiting.   sennosides-docusate sodium 8.6-50 MG tablet Commonly known as: SENOKOT-S Take 2 tablets by mouth daily.   sertraline 100 MG tablet Commonly known as: ZOLOFT TAKE 1.5 TABLETS (150 MG TOTAL) BY MOUTH EVERY EVENING. What changed: how much to take        Diagnostic Studies: DG Knee Left Port  Result Date: 01/06/2022 CLINICAL DATA:  Postop knee arthroplasty. EXAM: PORTABLE LEFT KNEE - 1-2 VIEW COMPARISON:  None Available. FINDINGS: Left knee arthroplasty in expected alignment. No periprosthetic lucency or fracture. There has been patellar resurfacing. Recent postsurgical change includes air and edema in the soft tissues and joint space. IMPRESSION: Left knee arthroplasty without immediate postoperative complication. Electronically Signed   By: Narda Rutherford M.D.   On: 01/06/2022 18:33    Disposition: Discharge disposition: 01-Home or Self Care          Follow-up Information     Teryl Lucy, MD. Schedule an appointment as soon as possible for a visit in 2 week(s).   Specialty: Orthopedic Surgery Contact information: [address] [place] [address] [phone]                  Signed: Annita Brod 01/07/2022, 3:04 PM

## 2022-01-07 NOTE — Progress Notes (Signed)
Patient discharged to home w/ husband. Given all belongings, instructions. Verbalized understanding of instructions. Escorted to pov via w/c. 

## 2022-01-07 NOTE — Evaluation (Signed)
Physical Therapy Evaluation Patient Details Name: Diane Walsh MRN: [id number] DOB: [birth year] Today's Date: 01/07/2022  History of Present Illness  66 yo female s/p L TKA. PMH: thyroid dz  Clinical Impression  Pt is meeting goals. Husband present  for part of session. Reviewed TKA HEP and mobility as noted below. Verbally reviewed stair technique and safety with pt and husband. Pt feels ready to d/c home, ok to d/c  from PT standpoint with family assist as needed .       Recommendations for follow up therapy are one component of a multi-disciplinary discharge planning process, led by the attending physician.  Recommendations may be updated based on patient status, additional functional criteria and insurance authorization.  Follow Up Recommendations Follow physician's recommendations for discharge plan and follow up therapies      Assistance Recommended at Discharge Intermittent Supervision/Assistance  Patient can return home with the following  Help with stairs or ramp for entrance;Assistance with cooking/housework;Assist for transportation    Equipment Recommendations None recommended by PT  Recommendations for Other Services       Functional Status Assessment Patient has had a recent decline in their functional status and demonstrates the ability to make significant improvements in function in a reasonable and predictable amount of time.     Precautions / Restrictions Precautions Precautions: Fall;Knee Restrictions Weight Bearing Restrictions: No Other Position/Activity Restrictions: WBAT      Mobility  Bed Mobility Overal bed mobility: Modified Independent Bed Mobility: Supine to Sit     Supine to sit: Min guard     General bed mobility comments: incr time, encouragement    Transfers Overall transfer level: Needs assistance Equipment used: Rolling walker (2 wheels) Transfers: Sit to/from Stand Sit to Stand: Supervision           General transfer  comment: cues for hand placement    Ambulation/Gait Ambulation/Gait assistance: Supervision, Min guard Gait Distance (Feet): 100 Feet Assistive device: Rolling walker (2 wheels) Gait Pattern/deviations: Step-to pattern       General Gait Details: cues for sequence and RW position  Stairs Stairs: Yes Stairs assistance: Min guard, Min assist Stair Management: One rail Left, Step to pattern, Forwards, With cane Number of Stairs: 3 General stair comments: cues for sequence and technique. good stability. no knee buckling  Wheelchair Mobility    Modified Rankin (Stroke Patients Only)       Balance                                             Pertinent Vitals/Pain Pain Assessment Pain Assessment: 0-10 Pain Score: 4  Pain Location: L knee Pain Descriptors / Indicators: Aching, Grimacing Pain Intervention(s): Limited activity within patient's tolerance, Monitored during session, Premedicated before session, Repositioned, Ice applied    Home Living Family/patient expects to be discharged to:: Private residence Living Arrangements: Spouse/significant other;Children;Other relatives Available Help at Discharge: Family Type of Home: House Home Access: Stairs to enter Entrance Stairs-Rails: Right Entrance Stairs-Number of Steps: 2   Home Layout: One level Home Equipment: Agricultural consultant (2 wheels);Cane - single point      Prior Function Prior Level of Function : Independent/Modified Independent                     Hand Dominance        Extremity/Trunk Assessment   Upper Extremity Assessment Upper  Extremity Assessment: Overall WFL for tasks assessed    Lower Extremity Assessment Lower Extremity Assessment: LLE deficits/detail LLE Deficits / Details: ankle WFL, knee and hip grossly 3/5; knee AROM ~ 12 to 50 degrees       Communication      Cognition Arousal/Alertness: Awake/alert Behavior During Therapy: WFL for tasks  assessed/performed Overall Cognitive Status: Within Functional Limits for tasks assessed                                          General Comments      Exercises Total Joint Exercises Ankle Circles/Pumps: AROM, Both, 10 reps Quad Sets: AROM, Both, 5 reps Heel Slides: AAROM, Left, 10 reps Straight Leg Raises: AROM, Left, 10 reps   Assessment/Plan    PT Assessment Patient needs continued PT services  PT Problem List Decreased strength;Decreased range of motion;Decreased activity tolerance;Decreased balance;Decreased mobility;Obesity       PT Treatment Interventions DME instruction;Therapeutic exercise;Gait training;Functional mobility training;Therapeutic activities;Patient/family education;Stair training    PT Goals (Current goals can be found in the Care Plan section)  Acute Rehab PT Goals PT Goal Formulation: With patient Time For Goal Achievement: 01/14/22 Potential to Achieve Goals: Good    Frequency 7X/week     Co-evaluation               AM-PAC PT "6 Clicks" Mobility  Outcome Measure Help needed turning from your back to your side while in a flat bed without using bedrails?: None Help needed moving from lying on your back to sitting on the side of a flat bed without using bedrails?: None Help needed moving to and from a bed to a chair (including a wheelchair)?: None Help needed standing up from a chair using your arms (e.g., wheelchair or bedside chair)?: A Little Help needed to walk in hospital room?: A Little Help needed climbing 3-5 steps with a railing? : A Little 6 Click Score: 21    End of Session Equipment Utilized During Treatment: Gait belt Activity Tolerance: Patient tolerated treatment well Patient left: in bed;with call bell/phone within reach;with bed alarm set Nurse Communication: Mobility status PT Visit Diagnosis: Other abnormalities of gait and mobility (R26.89);Difficulty in walking, not elsewhere classified (R26.2)     Time: 0277-4128 PT Time Calculation (min) (ACUTE ONLY): 26 min   Charges:   PT Evaluation $PT Eval Low Complexity: 1 Low PT Treatments $Gait Training: 8-22 mins $Therapeutic Exercise: 8-22 mins        Delice Bison, PT  Acute Rehab Dept [facility]) [phone]  [facility] Weekend Pager [facility] only)  [phone]  01/07/2022   [facility] 01/07/2022, 1:32 PM

## 2022-01-07 NOTE — Evaluation (Signed)
Physical Therapy Evaluation Patient Details Name: LORRY FURBER MRN: [id number] DOB: 1955/04/01 Today's Date: 01/07/2022  History of Present Illness  66 yo female s/p L TKA. PMH: thyroid dz  Clinical Impression  Pt admitted with above diagnosis.  PT amb, reviewed stairs. C/o dizziness however unchanged during session so likely d/t meds. Will see again this pm and pt is hopeful  to d/c home today  Pt currently with functional limitations due to the deficits listed below (see PT Problem List). Pt will benefit from skilled PT to increase their independence and safety with mobility to allow discharge to the venue listed below.          Recommendations for follow up therapy are one component of a multi-disciplinary discharge planning process, led by the attending physician.  Recommendations may be updated based on patient status, additional functional criteria and insurance authorization.  Follow Up Recommendations Follow physician's recommendations for discharge plan and follow up therapies      Assistance Recommended at Discharge Intermittent Supervision/Assistance  Patient can return home with the following  Help with stairs or ramp for entrance;Assistance with cooking/housework;Assist for transportation    Equipment Recommendations None recommended by PT  Recommendations for Other Services       Functional Status Assessment Patient has had a recent decline in their functional status and demonstrates the ability to make significant improvements in function in a reasonable and predictable amount of time.     Precautions / Restrictions Precautions Precautions: Fall;Knee Restrictions Weight Bearing Restrictions: No Other Position/Activity Restrictions: WBAT      Mobility  Bed Mobility Overal bed mobility: Needs Assistance Bed Mobility: Supine to Sit     Supine to sit: Min guard     General bed mobility comments: incr time, encouragement    Transfers Overall transfer  level: Needs assistance Equipment used: Rolling walker (2 wheels) Transfers: Sit to/from Stand Sit to Stand: Min guard           General transfer comment: cues for hand placement    Ambulation/Gait Ambulation/Gait assistance: Min guard Gait Distance (Feet): 60 Feet Assistive device: Rolling walker (2 wheels) Gait Pattern/deviations: Step-to pattern       General Gait Details: cues for sequence and RW position  Stairs Stairs: Yes Stairs assistance: Min guard, Min assist Stair Management: One rail Left, Step to pattern, Forwards, With cane Number of Stairs: 3 General stair comments: cues for sequence and technique. good stability. no knee buckling  Wheelchair Mobility    Modified Rankin (Stroke Patients Only)       Balance                                             Pertinent Vitals/Pain Pain Assessment Pain Assessment: 0-10 Pain Score: 4  Pain Location: L knee Pain Descriptors / Indicators: Aching, Grimacing Pain Intervention(s): Limited activity within patient's tolerance, Monitored during session, Premedicated before session, Repositioned    Home Living Family/patient expects to be discharged to:: Private residence Living Arrangements: Spouse/significant other;Children;Other relatives Available Help at Discharge: Family Type of Home: House Home Access: Stairs to enter Entrance Stairs-Rails: Right Entrance Stairs-Number of Steps: 2   Home Layout: One level Home Equipment: Agricultural consultant (2 wheels);Cane - single point      Prior Function Prior Level of Function : Independent/Modified Independent  Hand Dominance        Extremity/Trunk Assessment   Upper Extremity Assessment Upper Extremity Assessment: Overall WFL for tasks assessed    Lower Extremity Assessment Lower Extremity Assessment: LLE deficits/detail LLE Deficits / Details: ankle WFL, knee and hip grossly 3/5; knee AROM ~ 12 to 50 degrees        Communication      Cognition Arousal/Alertness: Awake/alert Behavior During Therapy: WFL for tasks assessed/performed Overall Cognitive Status: Within Functional Limits for tasks assessed                                          General Comments      Exercises Total Joint Exercises Ankle Circles/Pumps: AROM, Both, 10 reps   Assessment/Plan    PT Assessment Patient needs continued PT services  PT Problem List Decreased strength;Decreased range of motion;Decreased activity tolerance;Decreased balance;Decreased mobility;Obesity       PT Treatment Interventions DME instruction;Therapeutic exercise;Gait training;Functional mobility training;Therapeutic activities;Patient/family education;Stair training    PT Goals (Current goals can be found in the Care Plan section)  Acute Rehab PT Goals PT Goal Formulation: With patient Time For Goal Achievement: 01/14/22 Potential to Achieve Goals: Good    Frequency 7X/week     Co-evaluation               AM-PAC PT "6 Clicks" Mobility  Outcome Measure Help needed turning from your back to your side while in a flat bed without using bedrails?: None Help needed moving from lying on your back to sitting on the side of a flat bed without using bedrails?: None Help needed moving to and from a bed to a chair (including a wheelchair)?: A Little Help needed standing up from a chair using your arms (e.g., wheelchair or bedside chair)?: A Little Help needed to walk in hospital room?: A Little Help needed climbing 3-5 steps with a railing? : A Little 6 Click Score: 20    End of Session Equipment Utilized During Treatment: Gait belt Activity Tolerance: Patient tolerated treatment well Patient left: in chair;with call bell/phone within reach;with chair alarm set Nurse Communication: Mobility status PT Visit Diagnosis: Other abnormalities of gait and mobility (R26.89);Difficulty in walking, not elsewhere classified  (R26.2)    Time: 6834-1962 PT Time Calculation (min) (ACUTE ONLY): 23 min   Charges:   PT Evaluation $PT Eval Low Complexity: 1 Low PT Treatments $Gait Training: 8-22 mins        Delice Bison, PT  Acute Rehab Dept [facility]) [phone]  [facility] Weekend Pager [facility] only)  [phone]  01/07/2022   [facility] 01/07/2022, 10:23 AM

## 2022-03-31 ENCOUNTER — Other Ambulatory Visit: Payer: Self-pay | Admitting: Family Medicine

## 2022-03-31 DIAGNOSIS — Z1231 Encounter for screening mammogram for malignant neoplasm of breast: Secondary | ICD-10-CM

## 2022-04-29 ENCOUNTER — Ambulatory Visit
Admission: RE | Admit: 2022-04-29 | Discharge: 2022-04-29 | Disposition: A | Discharge status: Home / Self Care | Payer: Medicare PPO | Source: Ambulatory Visit | Attending: Family Medicine | Admitting: Family Medicine

## 2022-04-29 DIAGNOSIS — Z1231 Encounter for screening mammogram for malignant neoplasm of breast: Secondary | ICD-10-CM | POA: Diagnosis present

## 2022-05-04 ENCOUNTER — Other Ambulatory Visit: Payer: Self-pay | Admitting: Family Medicine

## 2022-05-04 DIAGNOSIS — R921 Mammographic calcification found on diagnostic imaging of breast: Secondary | ICD-10-CM

## 2022-05-04 DIAGNOSIS — R928 Other abnormal and inconclusive findings on diagnostic imaging of breast: Secondary | ICD-10-CM

## 2022-05-06 ENCOUNTER — Ambulatory Visit
Admission: RE | Admit: 2022-05-06 | Discharge: 2022-05-06 | Disposition: A | Discharge status: Home / Self Care | Payer: Medicare PPO | Source: Ambulatory Visit | Attending: Family Medicine | Admitting: Family Medicine

## 2022-05-06 DIAGNOSIS — R928 Other abnormal and inconclusive findings on diagnostic imaging of breast: Secondary | ICD-10-CM

## 2022-05-06 DIAGNOSIS — R921 Mammographic calcification found on diagnostic imaging of breast: Secondary | ICD-10-CM

## 2022-05-08 ENCOUNTER — Other Ambulatory Visit: Payer: Self-pay | Admitting: Family Medicine

## 2022-05-08 DIAGNOSIS — R921 Mammographic calcification found on diagnostic imaging of breast: Secondary | ICD-10-CM

## 2022-05-08 DIAGNOSIS — R928 Other abnormal and inconclusive findings on diagnostic imaging of breast: Secondary | ICD-10-CM

## 2022-05-12 ENCOUNTER — Ambulatory Visit
Admission: RE | Admit: 2022-05-12 | Discharge: 2022-05-12 | Disposition: A | Discharge status: Home / Self Care | Payer: Medicare PPO | Source: Ambulatory Visit | Attending: Family Medicine | Admitting: Family Medicine

## 2022-05-12 DIAGNOSIS — R921 Mammographic calcification found on diagnostic imaging of breast: Secondary | ICD-10-CM | POA: Diagnosis present

## 2022-05-12 DIAGNOSIS — R928 Other abnormal and inconclusive findings on diagnostic imaging of breast: Secondary | ICD-10-CM

## 2022-05-12 HISTORY — PX: BREAST BIOPSY: SHX20

## 2022-05-12 MED ORDER — LIDOCAINE-EPINEPHRINE 1 %-1:100000 IJ SOLN
20.0000 mL | Freq: Once | INTRAMUSCULAR | Status: AC
  Administered 2022-05-12: 20 mL
  Filled 2022-05-12: qty 20

## 2022-05-12 MED ORDER — LIDOCAINE HCL (PF) 1 % IJ SOLN
5.0000 mL | Freq: Once | INTRAMUSCULAR | Status: AC
  Administered 2022-05-12: 5 mL
  Filled 2022-05-12: qty 5

## 2022-05-13 LAB — SURGICAL PATHOLOGY

## 2022-05-19 ENCOUNTER — Ambulatory Visit: Payer: Self-pay | Admitting: Surgery

## 2022-05-19 NOTE — H&P (Signed)
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 female who was referred by Barnabas Lister, * for evaluation of above. Change was noted on last screening mammogram. Asymptomatic.    Past Medical History:  has a past medical history of Allergy, Anemia, Anxiety and depression (03/18/2015), Asthma without status asthmaticus, Bacteremia due to group B Streptococcus (08/19/2016), COPD (chronic obstructive pulmonary disease) (CMS-HCC), Depression, GERD (gastroesophageal reflux disease), Psoriasis, RAD (reactive airway disease) (03/18/2015), and Thyroid disease.  Past Surgical History:  has a past surgical history that includes Tonsillectomy; Cholecystectomy; Wisdom Teeth Extraction; and Colonoscopy (10/21/2020).  Family History: family history includes Alcohol abuse in her paternal grandfather; Dementia in her mother; Depression in her maternal grandmother and mother; Diabetes type II in her father and maternal grandmother; High blood pressure (Hypertension) in her father and sister; Kidney anomalies in her maternal grandfather; Myocardial Infarction (Heart attack) in her paternal grandmother; Stroke in her father and maternal grandmother; Thyroid disease in her mother.  Social History:  reports that she has never smoked. She has never used smokeless tobacco. She reports that she does not currently use alcohol. She reports that she does not use drugs.  Current Medications: has a current medication list which includes the following prescription(s): albuterol mdi (proventil, ventolin, proair) hfa, celecoxib, fluticasone furoate-vilanterol, levothyroxine, montelukast, omeprazole, and sertraline.  Allergies:  Allergies as of 05/19/2022   (No Known Allergies)    ROS:  A 15 point review of systems was performed and was negative except as noted in HPI   Objective:     BP (!) 150/79   Pulse 87   Ht 165.1 cm (5\' 5" )   Wt (!) 108.9 kg (240 lb)   BMI 39.94 kg/m    Constitutional :  No distress, cooperative, alert  Lymphatics/Throat:  Supple with no lymphadenopathy  Respiratory:  Clear to auscultation bilaterally  Cardiovascular:  Regular rate and rhythm  Gastrointestinal: Soft, non-tender, non-distended, no organomegaly.  Musculoskeletal: Steady gait and movement  Skin: Cool and moist  Psychiatric: Normal affect, non-agitated, not confused  Breast: Normal appearance and no palpable abnormality in bilateral breasts and axilla.  Chaperone present for exam.      LABS:  URGICAL PATHOLOGY SURGICAL PATHOLOGY CASE: [id number] PATIENT: [name] Surgical Pathology Report     Specimen Submitted: A. Breast, left  Clinical History: 4 mm group of CALCS.  Suspect benign, fibroadenoma      DIAGNOSIS: A. BREAST, LEFT, LOWER INNER QUADRANT; STEREOTACTIC BIOPSY: - INTRADUCTAL PAPILLOMA WITH COLUMNAR CELL CHANGE AND APOCRINE METAPLASIA. - DUCTAL MICROCALCIFICATIONS ASSOCIATED WITH FIBROCYSTIC CHANGE. - NEGATIVE FOR ATYPIA AND MALIGNANCY.   GROSS DESCRIPTION: A. Labeled: Left breast stereo biopsy calcifications lower inner Received: in a formalin-filled Brevera collection device Specimen radiograph image(s) available for review Time/Date in fixative: Collected at 8:31 AM on 05/12/2022 and placed in formalin at 8:33 AM on 05/12/2022 Cold ischemic time: Approximately 2 minutes Total fixation time: Approximately 8.5 hours Core pieces: Multiple Measurement: Aggregate, 1.6 x 1.0 x 0.8 cm Description / comments: Received are yellow fibrofatty tissue fragments. A diagram is provided on the specimen container lid, and sections A, B, F, and G are checked Inked: Blue Entirely submitted in cassette(s):  1-section A 2-section B 3-section F 4-section G 5-sections C and D 6-sections E, H, and I  CM 05/12/2022  Final Diagnosis performed by Raynelle Bring, MD.   Electronically signed 05/13/2022 9:16:01AM The electronic signature indicates  that the named Attending Pathologist has evaluated  the specimen Technical component performed at [facility], [address] [place], [place] [address] Lab: [phone] Dir: Rush Farmer, MD, MMM  Professional component performed at [facility], [facility], [place], [place],  [address] Lab: [phone] Dir: Kathi Simpers, MD    RADS: CLINICAL DATA:  Bilateral calcification callback.   EXAM:  DIGITAL DIAGNOSTIC BILATERAL MAMMOGRAM WITH TOMOSYNTHESIS   TECHNIQUE:  Bilateral digital diagnostic mammography and breast tomosynthesis  was performed.   COMPARISON:  Previous exam(s).   ACR Breast Density Category b: There are scattered areas of  fibroglandular density.   FINDINGS:  Spot magnification views of the RIGHT breast demonstrate favored  early rod calcifications in the upper breast at posterior depth.  These span approximately 11 mm. There are innumerable benign rod  calcifications throughout bilateral breasts.   Spot magnification views of the LEFT breast lower inner breast at  anterior to middle depth demonstrate a 4-5 mm group of punctate  calcifications. These are not definitively stable in comparison to  remote prior mammograms.   IMPRESSION:  1. A 4 mm group of calcifications in the LEFT breast are  indeterminate. Recommend stereotactic guided biopsy for definitive  characterization.  2. There are favored early rod calcifications in the RIGHT upper  breast at posterior depth. With benign biopsy results, recommend  RIGHT diagnostic mammogram in 6 months. This will establish 6 months  of definitive stability.   RECOMMENDATION:  1. LEFT breast stereotactic guided biopsy x1  2. With benign biopsy results, recommend diagnostic RIGHT breast  mammogram in 6 months (with ultrasound as deemed necessary). With  malignant or atypical results, recommend RIGHT breast stereotactic  guided biopsy.   I have discussed the findings and  recommendations with the patient.  The biopsy procedure was discussed with the patient and questions  were answered. Patient expressed their understanding of the biopsy  recommendation. Patient will be scheduled for biopsy at her earliest  convenience by the schedulers. Ordering provider will be notified.  If applicable, a reminder letter will be sent to the patient  regarding the next appointment.   BI-RADS CATEGORY  4: Suspicious.    Electronically Signed    By: Valentino Saxon M.D.    On: 05/06/2022 13:35   Addendum by Lajean Manes, MD on 05/14/2022  1:28 PM EDT  ADDENDUM REPORT: 05/14/2022 11:28   ADDENDUM:  PATHOLOGY revealed: A. BREAST, LEFT, LOWER INNER QUADRANT;  STEREOTACTIC BIOPSY: - INTRADUCTAL PAPILLOMA WITH COLUMNAR CELL  CHANGE AND APOCRINE METAPLASIA. - DUCTAL MICROCALCIFICATIONS  ASSOCIATED WITH FIBROCYSTIC CHANGE. - NEGATIVE FOR ATYPIA AND  MALIGNANCY.   Pathology results are CONCORDANT with imaging findings, per Dr.  Lajean Manes with excision recommended.   Pathology results and recommendations were discussed with patient  via telephone on 05/13/2022. Patient reported biopsy site doing well  with no adverse symptoms, and only slight tenderness at the site.  Post biopsy care instructions were reviewed, questions were answered  and my direct phone number was provided. Patient was instructed to  call [facility] for any additional questions or concerns  related to biopsy site.   RECOMMENDATIONS: 1. Surgical consultation for possible excision.  Electa Sniff RN notified provider office on 05/13/2022 and spoke with  Wendy Poet RN, who will arrange surgical consultation, and  contact patient regarding appointment details.   2. Patient instructed to return in six months for unilateral RIGHT  breast diagnostic mammogram to assess stability of rod-like  calcifications in RIGHT breast. Patient informed a reminder notice  will be sent regarding this  appointment and she will need to call  mammography site to schedule this appointment.   Pathology results reported by Electa Sniff RN on 05/14/2022.    Electronically Signed    By: Lajean Manes M.D.    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  Plan:     1. 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 The risks of general anesthetic, if used, includes MI, CVA, sudden death or even reaction to anesthetic medications also discussed.  Typical post-op recovery time and possbility of activity restrictions were also discussed.  Alternatives include continued observation.  Benefits include possible symptom relief, pathologic evaluation, and/or curative excision.   The patient verbalized understanding and all questions were answered to the patient's satisfaction.  2. Patient has elected to proceed with surgical treatment. Procedure will be scheduled.  Left lumpectomy, RF tag, no SLNB.  labs/images/medications/previous chart entries reviewed personally and relevant changes/updates noted above.

## 2022-05-19 NOTE — H&P (View-Only) (Signed)
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 female who was referred by Barnabas Lister, * for evaluation of above. Change was noted on last screening mammogram. Asymptomatic.    Past Medical History:  has a past medical history of Allergy, Anemia, Anxiety and depression (03/18/2015), Asthma without status asthmaticus, Bacteremia due to group B Streptococcus (08/19/2016), COPD (chronic obstructive pulmonary disease) (CMS-HCC), Depression, GERD (gastroesophageal reflux disease), Psoriasis, RAD (reactive airway disease) (03/18/2015), and Thyroid disease.  Past Surgical History:  has a past surgical history that includes Tonsillectomy; Cholecystectomy; Wisdom Teeth Extraction; and Colonoscopy (10/21/2020).  Family History: family history includes Alcohol abuse in her paternal grandfather; Dementia in her mother; Depression in her maternal grandmother and mother; Diabetes type II in her father and maternal grandmother; High blood pressure (Hypertension) in her father and sister; Kidney anomalies in her maternal grandfather; Myocardial Infarction (Heart attack) in her paternal grandmother; Stroke in her father and maternal grandmother; Thyroid disease in her mother.  Social History:  reports that she has never smoked. She has never used smokeless tobacco. She reports that she does not currently use alcohol. She reports that she does not use drugs.  Current Medications: has a current medication list which includes the following prescription(s): albuterol mdi (proventil, ventolin, proair) hfa, celecoxib, fluticasone furoate-vilanterol, levothyroxine, montelukast, omeprazole, and sertraline.  Allergies:  Allergies as of 05/19/2022   (No Known Allergies)    ROS:  A 15 point review of systems was performed and was negative except as noted in HPI   Objective:     BP (!) 150/79   Pulse 87   Ht 165.1 cm (5\' 5" )   Wt (!) 108.9 kg (240 lb)   BMI 39.94 kg/m    Constitutional :  No distress, cooperative, alert  Lymphatics/Throat:  Supple with no lymphadenopathy  Respiratory:  Clear to auscultation bilaterally  Cardiovascular:  Regular rate and rhythm  Gastrointestinal: Soft, non-tender, non-distended, no organomegaly.  Musculoskeletal: Steady gait and movement  Skin: Cool and moist  Psychiatric: Normal affect, non-agitated, not confused  Breast: Normal appearance and no palpable abnormality in bilateral breasts and axilla.  Chaperone present for exam.      LABS:  URGICAL PATHOLOGY SURGICAL PATHOLOGY CASE: [phone] PATIENT: Diane Walsh Surgical Pathology Report     Specimen Submitted: A. Breast, left  Clinical History: 4 mm group of CALCS.  Suspect benign, fibroadenoma      DIAGNOSIS: A. BREAST, LEFT, LOWER INNER QUADRANT; STEREOTACTIC BIOPSY: - INTRADUCTAL PAPILLOMA WITH COLUMNAR CELL CHANGE AND APOCRINE METAPLASIA. - DUCTAL MICROCALCIFICATIONS ASSOCIATED WITH FIBROCYSTIC CHANGE. - NEGATIVE FOR ATYPIA AND MALIGNANCY.   GROSS DESCRIPTION: A. Labeled: Left breast stereo biopsy calcifications lower inner Received: in a formalin-filled Brevera collection device Specimen radiograph image(s) available for review Time/Date in fixative: Collected at 8:31 AM on 05/12/2022 and placed in formalin at 8:33 AM on 05/12/2022 Cold ischemic time: Approximately 2 minutes Total fixation time: Approximately 8.5 hours Core pieces: Multiple Measurement: Aggregate, 1.6 x 1.0 x 0.8 cm Description / comments: Received are yellow fibrofatty tissue fragments. A diagram is provided on the specimen container lid, and sections A, B, F, and G are checked Inked: Blue Entirely submitted in cassette(s):  1-section A 2-section B 3-section F 4-section G 5-sections C and D 6-sections E, H, and I  CM 05/12/2022  Final Diagnosis performed by Raynelle Bring, MD.   Electronically signed 05/13/2022 9:16:01AM The electronic signature indicates  that the named Attending Pathologist has evaluated  the specimen Technical component performed at [facility], [address] [place], [place] [address] Lab: [phone] Dir: Rush Farmer, MD, MMM  Professional component performed at [facility], [facility], [place], [place], [place] [address] Lab: [phone] Dir: Kathi Simpers, MD    RADS: CLINICAL DATA:  Bilateral calcification callback.   EXAM:  DIGITAL DIAGNOSTIC BILATERAL MAMMOGRAM WITH TOMOSYNTHESIS   TECHNIQUE:  Bilateral digital diagnostic mammography and breast tomosynthesis  was performed.   COMPARISON:  Previous exam(s).   ACR Breast Density Category b: There are scattered areas of  fibroglandular density.   FINDINGS:  Spot magnification views of the RIGHT breast demonstrate favored  early rod calcifications in the upper breast at posterior depth.  These span approximately 11 mm. There are innumerable benign rod  calcifications throughout bilateral breasts.   Spot magnification views of the LEFT breast lower inner breast at  anterior to middle depth demonstrate a 4-5 mm group of punctate  calcifications. These are not definitively stable in comparison to  remote prior mammograms.   IMPRESSION:  1. A 4 mm group of calcifications in the LEFT breast are  indeterminate. Recommend stereotactic guided biopsy for definitive  characterization.  2. There are favored early rod calcifications in the RIGHT upper  breast at posterior depth. With benign biopsy results, recommend  RIGHT diagnostic mammogram in 6 months. This will establish 6 months  of definitive stability.   RECOMMENDATION:  1. LEFT breast stereotactic guided biopsy x1  2. With benign biopsy results, recommend diagnostic RIGHT breast  mammogram in 6 months (with ultrasound as deemed necessary). With  malignant or atypical results, recommend RIGHT breast stereotactic  guided biopsy.   I have discussed the findings and  recommendations with the patient.  The biopsy procedure was discussed with the patient and questions  were answered. Patient expressed their understanding of the biopsy  recommendation. Patient will be scheduled for biopsy at her earliest  convenience by the schedulers. Ordering provider will be notified.  If applicable, a reminder letter will be sent to the patient  regarding the next appointment.   BI-RADS CATEGORY  4: Suspicious.    Electronically Signed    By: Valentino Saxon M.D.    On: 05/06/2022 13:35   Addendum by Lajean Manes, MD on 05/14/2022  1:28 PM EDT  ADDENDUM REPORT: 05/14/2022 11:28   ADDENDUM:  PATHOLOGY revealed: A. BREAST, LEFT, LOWER INNER QUADRANT;  STEREOTACTIC BIOPSY: - INTRADUCTAL PAPILLOMA WITH COLUMNAR CELL  CHANGE AND APOCRINE METAPLASIA. - DUCTAL MICROCALCIFICATIONS  ASSOCIATED WITH FIBROCYSTIC CHANGE. - NEGATIVE FOR ATYPIA AND  MALIGNANCY.   Pathology results are CONCORDANT with imaging findings, per Dr.  Lajean Manes with excision recommended.   Pathology results and recommendations were discussed with patient  via telephone on 05/13/2022. Patient reported biopsy site doing well  with no adverse symptoms, and only slight tenderness at the site.  Post biopsy care instructions were reviewed, questions were answered  and my direct phone number was provided. Patient was instructed to  call [facility] for any additional questions or concerns  related to biopsy site.   RECOMMENDATIONS: 1. Surgical consultation for possible excision.  Electa Sniff RN notified provider office on 05/13/2022 and spoke with  Wendy Poet RN, who will arrange surgical consultation, and  contact patient regarding appointment details.   2. Patient instructed to return in six months for unilateral RIGHT  breast diagnostic mammogram to assess stability of rod-like  calcifications in RIGHT breast. Patient informed a reminder notice  will be sent regarding this  appointment and she will need to call  mammography site to schedule this appointment.   Pathology results reported by Electa Sniff RN on 05/14/2022.    Electronically Signed    By: Lajean Manes M.D.    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  Plan:     1. 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 The risks of general anesthetic, if used, includes MI, CVA, sudden death or even reaction to anesthetic medications also discussed.  Typical post-op recovery time and possbility of activity restrictions were also discussed.  Alternatives include continued observation.  Benefits include possible symptom relief, pathologic evaluation, and/or curative excision.   The patient verbalized understanding and all questions were answered to the patient's satisfaction.  2. Patient has elected to proceed with surgical treatment. Procedure will be scheduled.  Left lumpectomy, RF tag, no SLNB.  labs/images/medications/previous chart entries reviewed personally and relevant changes/updates noted above.

## 2022-05-20 ENCOUNTER — Other Ambulatory Visit: Payer: Self-pay | Admitting: Surgery

## 2022-05-20 DIAGNOSIS — D242 Benign neoplasm of left breast: Secondary | ICD-10-CM

## 2022-05-21 ENCOUNTER — Encounter
Admission: RE | Admit: 2022-05-21 | Discharge: 2022-05-21 | Disposition: A | Discharge status: Home / Self Care | Payer: Medicare PPO | Source: Ambulatory Visit | Attending: Surgery | Admitting: Surgery

## 2022-05-21 ENCOUNTER — Other Ambulatory Visit: Payer: Self-pay

## 2022-05-21 DIAGNOSIS — Z01812 Encounter for preprocedural laboratory examination: Secondary | ICD-10-CM

## 2022-05-21 HISTORY — DX: Prediabetes: R73.03

## 2022-05-21 HISTORY — DX: Unspecified asthma, uncomplicated: J45.909

## 2022-05-21 NOTE — Patient Instructions (Addendum)
Your procedure is scheduled on: 05/28/22 - Thursday Report to the Registration Desk on the 1st floor of the [facility]. To find out your arrival time, please call [phone] between 1PM - 3PM on: 05/27/22 - Wednesday If your arrival time is 6:00 am, do not arrive before that time as the [facility] entrance doors do not open until 6:00 am.  REMEMBER: Instructions that are not followed completely may result in serious medical risk, up to and including death; or upon the discretion of your surgeon and anesthesiologist your surgery may need to be rescheduled.  Do not eat food after midnight the night before surgery.  No gum chewing or hard candies.  You may however, drink CLEAR liquids up to 2 hours before you are scheduled to arrive for your surgery. Do not drink anything within 2 hours of your scheduled arrival time.  Clear liquids include: - water  - apple juice without pulp - gatorade (not RED colors) - black coffee or tea (Do NOT add milk or creamers to the coffee or tea) Do NOT drink anything that is not on this list.  One week prior to surgery: Stop Anti-inflammatories (NSAIDS) such as Advil, Aleve, Ibuprofen, Motrin, Naproxen, Naprosyn and Aspirin based products such as Excedrin, Goody's Powder, BC Powder.  Stop ANY OVER THE COUNTER supplements until after surgery.  You may take Tylenol if needed for pain up until the day of surgery.   TAKE ONLY THESE MEDICATIONS THE MORNING OF SURGERY WITH A SIP OF WATER:  omeprazole (PRILOSEC) - (take one the night before and one on the morning of surgery - helps to prevent nausea after surgery.) levothyroxine (SYNTHROID)  BREO ELLIPTA  celecoxib (CELEBREX)   Use albuterol (PROAIR HFA)  on the day of surgery and bring to the hospital.  Stop aspirin EC 325 MG beginning 05/22/22.  No Alcohol for 24 hours before or after surgery.  No Smoking including e-cigarettes for 24 hours before surgery.  No chewable tobacco products for at  least 6 hours before surgery.  No nicotine patches on the day of surgery.  Do not use any "recreational" drugs for at least a week (preferably 2 weeks) before your surgery.  Please be advised that the combination of cocaine and anesthesia may have negative outcomes, up to and including death. If you test positive for cocaine, your surgery will be cancelled.  On the morning of surgery brush your teeth with toothpaste and water, you may rinse your mouth with mouthwash if you wish. Do not swallow any toothpaste or mouthwash.  Use CHG Soap or wipes as directed on instruction sheet.  Do not wear jewelry, make-up, hairpins, clips or nail polish.  Do not wear lotions, powders, or perfumes.   Do not shave body hair from the neck down 48 hours before surgery.  Contact lenses, hearing aids and dentures may not be worn into surgery.  Do not bring valuables to the hospital. [facility] is not responsible for any missing/lost belongings or valuables.   Notify your doctor if there is any change in your medical condition (cold, fever, infection).  Wear comfortable clothing (specific to your surgery type) to the hospital.  After surgery, you can help prevent lung complications by doing breathing exercises.  Take deep breaths and cough every 1-2 hours. Your doctor may order a device called an Incentive Spirometer to help you take deep breaths. When coughing or sneezing, hold a pillow firmly against your incision with both hands. This is called "splinting." Doing  this helps protect your incision. It also decreases belly discomfort.  If you are being admitted to the hospital overnight, leave your suitcase in the car. After surgery it may be brought to your room.  In case of increased patient census, it may be necessary for you, the patient, to continue your postoperative care in the Same Day Surgery department.  If you are being discharged the day of surgery, you will not be allowed to drive home. You  will need a responsible individual to drive you home and stay with you for 24 hours after surgery.   If you are taking public transportation, you will need to have a responsible individual with you.  Please call the [place] Dept. at [phone] if you have any questions about these instructions.  Surgery Visitation Policy:  Patients undergoing a surgery or procedure may have two family members or support persons with them as long as the person is not COVID-19 positive or experiencing its symptoms.   Inpatient Visitation:    Visiting hours are 7 a.m. to 8 p.m. Up to four visitors are allowed at one time in a patient room. The visitors may rotate out with other people during the day. One designated support person (adult) may remain overnight.Your procedure is scheduled on: Report to the Registration Desk on the 1st floor of the [facility]. To find out your arrival time, please call [phone] between 1PM - 3PM on: If your arrival time is 6:00 am, do not arrive before that time as the [facility] entrance doors do not open until 6:00 am.  REMEMBER: Instructions that are not followed completely may result in serious medical risk, up to and including death; or upon the discretion of your surgeon and anesthesiologist your surgery may need to be rescheduled.  Do not eat food after midnight the night before surgery.  No gum chewing or hard candies.  You may however, drink CLEAR liquids up to 2 hours before you are scheduled to arrive for your surgery. Do not drink anything within 2 hours of your scheduled arrival time.  Clear liquids include: - water  - apple juice without pulp - gatorade (not RED colors) - black coffee or tea (Do NOT add milk or creamers to the coffee or tea) Do NOT drink anything that is not on this list.  Type 1 and Type 2 diabetics should only drink water.  In addition, your doctor has ordered for you to drink the provided:  Ensure Pre-Surgery  Clear Carbohydrate Drink  Gatorade G2 Drinking this carbohydrate drink up to two hours before surgery helps to reduce insulin resistance and improve patient outcomes. Please complete drinking 2 hours before scheduled arrival time.  One week prior to surgery: Stop Anti-inflammatories (NSAIDS) such as Advil, Aleve, Ibuprofen, Motrin, Naproxen, Naprosyn and Aspirin based products such as Excedrin, Goody's Powder, BC Powder. Stop ANY OVER THE COUNTER supplements until after surgery. You may however, continue to take Tylenol if needed for pain up until the day of surgery.  Continue taking all prescribed medications with the exception of the following:  **Follow guidelines for insulin and diabetes medications**  Follow recommendations from Cardiologist or PCP regarding stopping blood thinners.  TAKE ONLY THESE MEDICATIONS THE MORNING OF SURGERY WITH A SIP OF WATER:    Antacid (take one the night before and one on the morning of surgery - helps to prevent nausea after surgery.)  Use inhalers on the day of surgery and bring to the hospital.  [facility]  enema or bowel prep as directed.  No Alcohol for 24 hours before or after surgery.  No Smoking including e-cigarettes for 24 hours before surgery.  No chewable tobacco products for at least 6 hours before surgery.  No nicotine patches on the day of surgery.  Do not use any "recreational" drugs for at least a week (preferably 2 weeks) before your surgery.  Please be advised that the combination of cocaine and anesthesia may have negative outcomes, up to and including death. If you test positive for cocaine, your surgery will be cancelled.  On the morning of surgery brush your teeth with toothpaste and water, you may rinse your mouth with mouthwash if you wish. Do not swallow any toothpaste or mouthwash.  Use CHG Soap or wipes as directed on instruction sheet.  Do not wear jewelry, make-up, hairpins, clips or nail polish.  Do not wear  lotions, powders, or perfumes.   Do not shave body hair from the neck down 48 hours before surgery.  Contact lenses, hearing aids and dentures may not be worn into surgery.  Do not bring valuables to the hospital. [facility] is not responsible for any missing/lost belongings or valuables.   Total Shoulder Arthroplasty:  use Benzoyl Peroxide 5% Gel as directed on instruction sheet.  Bring your C-PAP to the hospital in case you may have to spend the night.   Notify your doctor if there is any change in your medical condition (cold, fever, infection).  Wear comfortable clothing (specific to your surgery type) to the hospital.  After surgery, you can help prevent lung complications by doing breathing exercises.  Take deep breaths and cough every 1-2 hours. Your doctor may order a device called an Incentive Spirometer to help you take deep breaths. When coughing or sneezing, hold a pillow firmly against your incision with both hands. This is called "splinting." Doing this helps protect your incision. It also decreases belly discomfort.  If you are being admitted to the hospital overnight, leave your suitcase in the car. After surgery it may be brought to your room.  In case of increased patient census, it may be necessary for you, the patient, to continue your postoperative care in the Same Day Surgery department.  If you are being discharged the day of surgery, you will not be allowed to drive home. You will need a responsible individual to drive you home and stay with you for 24 hours after surgery.   If you are taking public transportation, you will need to have a responsible individual with you.  Please call the [facility] Dept. at [phone] if you have any questions about these instructions.  Surgery Visitation Policy:  Patients undergoing a surgery or procedure may have two family members or support persons with them as long as the person is not COVID-19 positive  or experiencing its symptoms.   Inpatient Visitation:    Visiting hours are 7 a.m. to 8 p.m. Up to four visitors are allowed at one time in a patient room. The visitors may rotate out with other people during the day. One designated support person (adult) may remain overnight.    Preparing for Surgery with CHLORHEXIDINE GLUCONATE (CHG) Soap  Chlorhexidine Gluconate (CHG) Soap  o An antiseptic cleaner that kills germs and bonds with the skin to continue killing germs even after washing  o Used for showering the night before surgery and morning of surgery  Before surgery, you can play an important role by reducing the number  of germs on your skin.  CHG (Chlorhexidine gluconate) soap is an antiseptic cleanser which kills germs and bonds with the skin to continue killing germs even after washing.  Please do not use if you have an allergy to CHG or antibacterial soaps. If your skin becomes reddened/irritated stop using the CHG.  1. Shower the NIGHT BEFORE SURGERY and the MORNING OF SURGERY with CHG soap.  2. If you choose to wash your hair, wash your hair first as usual with your normal shampoo.  3. After shampooing, rinse your hair and body thoroughly to remove the shampoo.  4. Use CHG as you would any other liquid soap. You can apply CHG directly to the skin and wash gently with a scrungie or a clean washcloth.  5. Apply the CHG soap to your body only from the neck down. Do not use on open wounds or open sores. Avoid contact with your eyes, ears, mouth, and genitals (private parts). Wash face and genitals (private parts) with your normal soap.  6. Wash thoroughly, paying special attention to the area where your surgery will be performed.  7. Thoroughly rinse your body with warm water.  8. Do not shower/wash with your normal soap after using and rinsing off the CHG soap.  9. Pat yourself dry with a clean towel.  10. Wear clean pajamas to bed the night before surgery.  12. Place  clean sheets on your bed the night of your first shower and do not sleep with pets.  13. Shower again with the CHG soap on the day of surgery prior to arriving at the hospital.  14. Do not apply any deodorants/lotions/powders.  15. Please wear clean clothes to the hospital.

## 2022-05-22 ENCOUNTER — Encounter
Admission: RE | Admit: 2022-05-22 | Discharge: 2022-05-22 | Disposition: A | Discharge status: Home / Self Care | Payer: Medicare PPO | Source: Ambulatory Visit | Attending: Surgery | Admitting: Surgery

## 2022-05-22 DIAGNOSIS — Z01818 Encounter for other preprocedural examination: Secondary | ICD-10-CM | POA: Diagnosis present

## 2022-05-22 DIAGNOSIS — Z01810 Encounter for preprocedural cardiovascular examination: Secondary | ICD-10-CM

## 2022-05-22 DIAGNOSIS — Z01812 Encounter for preprocedural laboratory examination: Secondary | ICD-10-CM

## 2022-05-22 LAB — CBC
HCT: 36.6 % (ref 36.0–46.0)
Hemoglobin: 11.6 g/dL — ABNORMAL LOW (ref 12.0–15.0)
MCH: 28.5 pg (ref 26.0–34.0)
MCHC: 31.7 g/dL (ref 30.0–36.0)
MCV: 89.9 fL (ref 80.0–100.0)
Platelets: 284 10*3/uL (ref 150–400)
RBC: 4.07 MIL/uL (ref 3.87–5.11)
RDW: 14.6 % (ref 11.5–15.5)
WBC: 7.4 10*3/uL (ref 4.0–10.5)
nRBC: 0 % (ref 0.0–0.2)

## 2022-05-22 LAB — COMPREHENSIVE METABOLIC PANEL
ALT: 15 U/L (ref 0–44)
AST: 19 U/L (ref 15–41)
Albumin: 4 g/dL (ref 3.5–5.0)
Alkaline Phosphatase: 92 U/L (ref 38–126)
Anion gap: 8 (ref 5–15)
BUN: 11 mg/dL (ref 8–23)
CO2: 26 mmol/L (ref 22–32)
Calcium: 8.9 mg/dL (ref 8.9–10.3)
Chloride: 106 mmol/L (ref 98–111)
Creatinine, Ser: 0.82 mg/dL (ref 0.44–1.00)
GFR, Estimated: >60 mL/min (ref >60)
Glucose, Bld: 111 mg/dL — ABNORMAL HIGH (ref 70–99)
Potassium: 4.2 mmol/L (ref 3.5–5.1)
Sodium: 140 mmol/L (ref 135–145)
Total Bilirubin: 0.7 mg/dL (ref 0.3–1.2)
Total Protein: 7.2 g/dL (ref 6.5–8.1)

## 2022-05-26 ENCOUNTER — Ambulatory Visit
Admission: RE | Admit: 2022-05-26 | Discharge: 2022-05-26 | Disposition: A | Discharge status: Home / Self Care | Payer: Medicare PPO | Source: Ambulatory Visit

## 2022-05-26 ENCOUNTER — Ambulatory Visit
Admission: RE | Admit: 2022-05-26 | Discharge: 2022-05-26 | Disposition: A | Discharge status: Home / Self Care | Payer: Medicare PPO | Source: Ambulatory Visit | Attending: Surgery | Admitting: Surgery

## 2022-05-26 DIAGNOSIS — D242 Benign neoplasm of left breast: Secondary | ICD-10-CM

## 2022-05-26 HISTORY — PX: BREAST BIOPSY: SHX20

## 2022-05-26 MED ORDER — LIDOCAINE HCL (PF) 1 % IJ SOLN
5.0000 mL | Freq: Once | INTRAMUSCULAR | Status: AC
  Administered 2022-05-26: 5 mL

## 2022-05-27 MED ORDER — CHLORHEXIDINE GLUCONATE 0.12 % MT SOLN: 15.0000 mL | Freq: Once | OROMUCOSAL | Status: AC

## 2022-05-27 MED ORDER — CHLORHEXIDINE GLUCONATE CLOTH 2 % EX PADS: 6.0000 | MEDICATED_PAD | Freq: Once | CUTANEOUS | Status: DC

## 2022-05-27 MED ORDER — CEFAZOLIN SODIUM-DEXTROSE 2-4 GM/100ML-% IV SOLN
2.0000 g | INTRAVENOUS | Status: AC
  Administered 2022-05-28: 2 g via INTRAVENOUS

## 2022-05-27 MED ORDER — LACTATED RINGERS IV SOLN: INTRAVENOUS | Status: DC

## 2022-05-27 MED ORDER — ORAL CARE MOUTH RINSE: 15.0000 mL | Freq: Once | OROMUCOSAL | Status: AC

## 2022-05-28 ENCOUNTER — Ambulatory Visit: Payer: Medicare PPO | Admitting: Certified Registered"

## 2022-05-28 ENCOUNTER — Ambulatory Visit: Payer: Medicare PPO | Admitting: Urgent Care

## 2022-05-28 ENCOUNTER — Encounter: Payer: Self-pay | Admitting: Surgery

## 2022-05-28 ENCOUNTER — Ambulatory Visit
Admission: RE | Admit: 2022-05-28 | Discharge: 2022-05-28 | Disposition: A | Discharge status: Home / Self Care | Payer: Medicare PPO | Attending: Surgery | Admitting: Surgery

## 2022-05-28 ENCOUNTER — Ambulatory Visit
Admission: RE | Admit: 2022-05-28 | Discharge: 2022-05-28 | Disposition: A | Discharge status: Home / Self Care | Payer: Medicare PPO | Source: Ambulatory Visit | Attending: Surgery | Admitting: Surgery

## 2022-05-28 ENCOUNTER — Other Ambulatory Visit: Payer: Self-pay

## 2022-05-28 ENCOUNTER — Encounter
Admission: RE | Disposition: A | Discharge status: Home / Self Care | Payer: Self-pay | Source: Home / Self Care | Attending: Surgery

## 2022-05-28 DIAGNOSIS — F419 Anxiety disorder, unspecified: Secondary | ICD-10-CM

## 2022-05-28 DIAGNOSIS — J4489 Other specified chronic obstructive pulmonary disease: Secondary | ICD-10-CM | POA: Insufficient documentation

## 2022-05-28 DIAGNOSIS — J309 Allergic rhinitis, unspecified: Secondary | ICD-10-CM

## 2022-05-28 DIAGNOSIS — F32A Depression, unspecified: Secondary | ICD-10-CM | POA: Diagnosis not present

## 2022-05-28 DIAGNOSIS — D242 Benign neoplasm of left breast: Secondary | ICD-10-CM

## 2022-05-28 DIAGNOSIS — E039 Hypothyroidism, unspecified: Secondary | ICD-10-CM | POA: Diagnosis not present

## 2022-05-28 DIAGNOSIS — K219 Gastro-esophageal reflux disease without esophagitis: Secondary | ICD-10-CM | POA: Diagnosis not present

## 2022-05-28 DIAGNOSIS — N6082 Other benign mammary dysplasias of left breast: Secondary | ICD-10-CM | POA: Insufficient documentation

## 2022-05-28 SURGERY — PARTIAL MASTECTOMY WITH RADIO FREQUENCY LOCALIZER
Anesthesia: General | Site: Breast | Laterality: Left

## 2022-05-28 MED ORDER — LIDOCAINE HCL (CARDIAC) PF 100 MG/5ML IV SOSY
PREFILLED_SYRINGE | INTRAVENOUS | Status: DC | PRN
  Administered 2022-05-28: 100 mg via INTRAVENOUS

## 2022-05-28 MED ORDER — CHLORHEXIDINE GLUCONATE 0.12 % MT SOLN
OROMUCOSAL | Status: AC
  Administered 2022-05-28: 15 mL via OROMUCOSAL
  Filled 2022-05-28: qty 15

## 2022-05-28 MED ORDER — OXYCODONE HCL 5 MG PO TABS: 5.0000 mg | ORAL_TABLET | Freq: Once | ORAL | Status: DC | PRN

## 2022-05-28 MED ORDER — ROCURONIUM BROMIDE 10 MG/ML (PF) SYRINGE
PREFILLED_SYRINGE | INTRAVENOUS | Status: AC
  Filled 2022-05-28: qty 10

## 2022-05-28 MED ORDER — CEFAZOLIN SODIUM-DEXTROSE 2-4 GM/100ML-% IV SOLN
INTRAVENOUS | Status: AC
  Filled 2022-05-28: qty 100

## 2022-05-28 MED ORDER — LACTATED RINGERS IV SOLN: INTRAVENOUS | Status: DC

## 2022-05-28 MED ORDER — MIDAZOLAM HCL 2 MG/2ML IJ SOLN
INTRAMUSCULAR | Status: DC | PRN
  Administered 2022-05-28: 2 mg via INTRAVENOUS

## 2022-05-28 MED ORDER — OXYCODONE HCL 5 MG PO TABS: 5.0000 mg | ORAL_TABLET | Freq: Three times a day (TID) | ORAL | 0 refills | Status: DC | PRN

## 2022-05-28 MED ORDER — PROPOFOL 10 MG/ML IV BOLUS
INTRAVENOUS | Status: AC
  Filled 2022-05-28: qty 20

## 2022-05-28 MED ORDER — ONDANSETRON HCL 4 MG/2ML IJ SOLN: 4.0000 mg | Freq: Once | INTRAMUSCULAR | Status: DC | PRN

## 2022-05-28 MED ORDER — DEXAMETHASONE SODIUM PHOSPHATE 10 MG/ML IJ SOLN
INTRAMUSCULAR | Status: DC | PRN
  Administered 2022-05-28: 4 mg via INTRAVENOUS

## 2022-05-28 MED ORDER — MONTELUKAST SODIUM 10 MG PO TABS: 10.0000 mg | ORAL_TABLET | Freq: Every day | ORAL | Status: AC | PRN

## 2022-05-28 MED ORDER — FLUTICASONE FUROATE-VILANTEROL 100-25 MCG/INH IN AEPB: 1.0000 | INHALATION_SPRAY | RESPIRATORY_TRACT | Status: AC | PRN

## 2022-05-28 MED ORDER — LIDOCAINE HCL (PF) 1 % IJ SOLN
INTRAMUSCULAR | Status: DC | PRN
  Administered 2022-05-28: 10 mL

## 2022-05-28 MED ORDER — ONDANSETRON HCL 4 MG/2ML IJ SOLN
INTRAMUSCULAR | Status: DC | PRN
  Administered 2022-05-28: 4 mg via INTRAVENOUS

## 2022-05-28 MED ORDER — LIDOCAINE HCL (PF) 2 % IJ SOLN
INTRAMUSCULAR | Status: AC
  Filled 2022-05-28: qty 5

## 2022-05-28 MED ORDER — DEXAMETHASONE SODIUM PHOSPHATE 10 MG/ML IJ SOLN
INTRAMUSCULAR | Status: AC
  Filled 2022-05-28: qty 1

## 2022-05-28 MED ORDER — BUPIVACAINE-EPINEPHRINE 0.5% -1:200000 IJ SOLN
INTRAMUSCULAR | Status: DC | PRN
  Administered 2022-05-28: 10 mL

## 2022-05-28 MED ORDER — BUPIVACAINE HCL (PF) 0.5 % IJ SOLN
INTRAMUSCULAR | Status: AC
  Filled 2022-05-28: qty 30

## 2022-05-28 MED ORDER — FENTANYL CITRATE (PF) 100 MCG/2ML IJ SOLN
INTRAMUSCULAR | Status: AC
  Filled 2022-05-28: qty 2

## 2022-05-28 MED ORDER — ACETAMINOPHEN 10 MG/ML IV SOLN: 1000.0000 mg | Freq: Once | INTRAVENOUS | Status: DC | PRN

## 2022-05-28 MED ORDER — OXYCODONE HCL 5 MG/5ML PO SOLN: 5.0000 mg | Freq: Once | ORAL | Status: DC | PRN

## 2022-05-28 MED ORDER — ONDANSETRON HCL 4 MG/2ML IJ SOLN
INTRAMUSCULAR | Status: AC
  Filled 2022-05-28: qty 2

## 2022-05-28 MED ORDER — SERTRALINE HCL 100 MG PO TABS: 100.0000 mg | ORAL_TABLET | Freq: Every evening | ORAL | Status: AC

## 2022-05-28 MED ORDER — SCOPOLAMINE 1 MG/3DAYS TD PT72
MEDICATED_PATCH | TRANSDERMAL | Status: AC
  Filled 2022-05-28: qty 1

## 2022-05-28 MED ORDER — SCOPOLAMINE 1 MG/3DAYS TD PT72
1.0000 | MEDICATED_PATCH | TRANSDERMAL | Status: DC
  Administered 2022-05-28: 1.5 mg via TRANSDERMAL

## 2022-05-28 MED ORDER — TRAMADOL HCL 50 MG PO TABS: 50.0000 mg | ORAL_TABLET | Freq: Four times a day (QID) | ORAL | 0 refills | Status: DC | PRN

## 2022-05-28 MED ORDER — MIDAZOLAM HCL 2 MG/2ML IJ SOLN
INTRAMUSCULAR | Status: AC
  Filled 2022-05-28: qty 2

## 2022-05-28 MED ORDER — EPHEDRINE 5 MG/ML INJ
INTRAVENOUS | Status: AC
  Filled 2022-05-28: qty 5

## 2022-05-28 MED ORDER — EPHEDRINE SULFATE-NACL 50-0.9 MG/10ML-% IV SOSY
PREFILLED_SYRINGE | INTRAVENOUS | Status: DC | PRN
  Administered 2022-05-28: 10 mg via INTRAVENOUS
  Administered 2022-05-28: 5 mg via INTRAVENOUS

## 2022-05-28 MED ORDER — FENTANYL CITRATE (PF) 100 MCG/2ML IJ SOLN
INTRAMUSCULAR | Status: DC | PRN
  Administered 2022-05-28: 50 ug via INTRAVENOUS

## 2022-05-28 MED ORDER — IBUPROFEN 400 MG PO TABS: 400.0000 mg | ORAL_TABLET | Freq: Three times a day (TID) | ORAL | 0 refills | Status: DC | PRN

## 2022-05-28 MED ORDER — PROPOFOL 10 MG/ML IV BOLUS
INTRAVENOUS | Status: DC | PRN
  Administered 2022-05-28: 130 mg via INTRAVENOUS

## 2022-05-28 MED ORDER — EPINEPHRINE PF 1 MG/ML IJ SOLN
INTRAMUSCULAR | Status: AC
  Filled 2022-05-28: qty 1

## 2022-05-28 MED ORDER — FENTANYL CITRATE (PF) 100 MCG/2ML IJ SOLN: 25.0000 ug | INTRAMUSCULAR | Status: DC | PRN

## 2022-05-28 MED ORDER — LIDOCAINE HCL (PF) 1 % IJ SOLN
INTRAMUSCULAR | Status: AC
  Filled 2022-05-28: qty 30

## 2022-05-28 SURGICAL SUPPLY — 49 items
ADH SKN CLS APL DERMABOND .7 (GAUZE/BANDAGES/DRESSINGS) ×1
APL PRP STRL LF DISP 70% ISPRP (MISCELLANEOUS) ×1
APPLIER CLIP 11 MED OPEN (CLIP)
APR CLP MED 11 20 MLT OPN (CLIP)
BLADE PHOTON ILLUMINATED (MISCELLANEOUS) ×1 IMPLANT
BLADE SURG 15 STRL LF DISP TIS (BLADE) ×1 IMPLANT
BLADE SURG 15 STRL SS (BLADE) ×1
CHLORAPREP W/TINT 26 (MISCELLANEOUS) ×1 IMPLANT
CLIP APPLIE 11 MED OPEN (CLIP) IMPLANT
CNTNR URN SCR LID CUP LEK RST (MISCELLANEOUS) ×1 IMPLANT
CONT SPEC 4OZ STRL OR WHT (MISCELLANEOUS)
DERMABOND ADVANCED .7 DNX12 (GAUZE/BANDAGES/DRESSINGS) ×1 IMPLANT
DEVICE DUBIN SPECIMEN MAMMOGRA (MISCELLANEOUS) ×1 IMPLANT
DRAPE LAPAROTOMY TRNSV 106X77 (MISCELLANEOUS) ×1 IMPLANT
ELECT CAUTERY BLADE TIP 2.5 (TIP) ×1
ELECT REM PT RETURN 9FT ADLT (ELECTROSURGICAL) ×1
ELECTRODE CAUTERY BLDE TIP 2.5 (TIP) ×1 IMPLANT
ELECTRODE REM PT RTRN 9FT ADLT (ELECTROSURGICAL) ×1 IMPLANT
GAUZE 4X4 16PLY ~~LOC~~+RFID DBL (SPONGE) ×1 IMPLANT
GLOVE BIOGEL PI IND STRL 7.0 (GLOVE) ×1 IMPLANT
GLOVE SURG SYN 6.5 ES PF (GLOVE) ×2 IMPLANT
GLOVE SURG SYN 6.5 PF PI (GLOVE) ×1 IMPLANT
GOWN STRL REUS W/ TWL LRG LVL3 (GOWN DISPOSABLE) ×3 IMPLANT
GOWN STRL REUS W/TWL LRG LVL3 (GOWN DISPOSABLE) ×2
JACKSON PRATT 10 (INSTRUMENTS) IMPLANT
KIT MARKER MARGIN INK (KITS) IMPLANT
KIT TURNOVER KIT A (KITS) ×1 IMPLANT
LABEL OR SOLS (LABEL) ×1 IMPLANT
LIGHT WAVEGUIDE WIDE FLAT (MISCELLANEOUS) IMPLANT
MANIFOLD NEPTUNE II (INSTRUMENTS) ×1 IMPLANT
MARKER MARGIN CORRECT CLIP (MARKER) ×1 IMPLANT
NDL HYPO 22X1.5 SAFETY MO (MISCELLANEOUS) ×2 IMPLANT
NEEDLE HYPO 22X1.5 SAFETY MO (MISCELLANEOUS) ×1 IMPLANT
PACK BASIN MINOR ARMC (MISCELLANEOUS) ×1 IMPLANT
SET LOCALIZER 20 PROBE US (MISCELLANEOUS) ×1 IMPLANT
SUT MNCRL 4-0 (SUTURE) ×2
SUT MNCRL 4-0 27XMFL (SUTURE) ×2
SUT SILK 2 0 (SUTURE)
SUT SILK 2-0 30XBRD TIE 12 (SUTURE) IMPLANT
SUT SILK 3 0 12 30 (SUTURE) IMPLANT
SUT VIC AB 3-0 SH 27 (SUTURE) ×2
SUT VIC AB 3-0 SH 27X BRD (SUTURE) ×2 IMPLANT
SUTURE MNCRL 4-0 27XMF (SUTURE) ×2 IMPLANT
SYR 20ML LL LF (SYRINGE) ×1 IMPLANT
TRAP FLUID SMOKE EVACUATOR (MISCELLANEOUS) ×1 IMPLANT
TRAP NEPTUNE SPECIMEN COLLECT (MISCELLANEOUS) ×1 IMPLANT
TUBING CONNECTING 10 (TUBING) ×1 IMPLANT
WATER STERILE IRR 1000ML POUR (IV SOLUTION) ×1 IMPLANT
WATER STERILE IRR 500ML POUR (IV SOLUTION) ×1 IMPLANT

## 2022-05-28 NOTE — Transfer of Care (Signed)
Immediate Anesthesia Transfer of Care Note  Patient: Diane Walsh  Procedure(s) Performed: PARTIAL MASTECTOMY WITH RADIO FREQUENCY LOCALIZER (Left: Breast)  Patient Location: PACU  Anesthesia Type:General  Level of Consciousness: drowsy  Airway & Oxygen Therapy: Patient Spontanous Breathing and Patient connected to face mask oxygen  Post-op Assessment: Report given to RN, Post -op Vital signs reviewed and stable, and Patient moving all extremities X 4  Post vital signs: Reviewed and stable  Last Vitals:  Vitals Value Taken Time  BP 144/80   Temp    Pulse 75 05/28/22 1436  Resp 22 05/28/22 1436  SpO2 100 % 05/28/22 1436  Vitals shown include unvalidated device data.  Last Pain:  Vitals:   05/28/22 1304  TempSrc: Oral  PainSc: 0-No pain         Complications: No notable events documented.

## 2022-05-28 NOTE — Interval H&P Note (Signed)
No change. OK to proceed.

## 2022-05-28 NOTE — Discharge Instructions (Addendum)
Removal, Care After This sheet gives you information about how to care for yourself after your procedure. Your health care provider may also give you more specific instructions. If you have problems or questions, contact your health care provider. What can I expect after the procedure? After the procedure, it is common to have: Soreness. Bruising. Itching. Follow these instructions at home: site care Follow instructions from your health care provider about how to take care of your site. Make sure you: Wash your hands with soap and water before and after you change your bandage (dressing). If soap and water are not available, use hand sanitizer. Leave stitches (sutures), skin glue, or adhesive strips in place. These skin closures may need to stay in place for 2 weeks or longer. If adhesive strip edges start to loosen and curl up, you may trim the loose edges. Do not remove adhesive strips completely unless your health care provider tells you to do that. If the area bleeds or bruises, apply gentle pressure for 10 minutes. OK TO SHOWER IN 24HRS  Check your site every day for signs of infection. Check for: Redness, swelling, or pain. Fluid or blood. Warmth. Pus or a bad smell.  General instructions Rest and then return to your normal activities as told by your health care provider. RESUME ASPIRIN IN 48HRS  tylenol and advil as needed for discomfort.  Please alternate between the two every four hours as needed for pain.    Use narcotics, if prescribed, only when tylenol and motrin is not enough to control pain.  325-650mg every 8hrs to max of 3000mg/24hrs (including the 325mg in every norco dose) for the tylenol.    Advil up to 800mg per dose every 8hrs as needed for pain.   Keep all follow-up visits as told by your health care provider. This is important. Contact a health care provider if: You have redness, swelling, or pain around your site. You have fluid or blood coming from your  site. Your site feels warm to the touch. You have pus or a bad smell coming from your site. You have a fever. Your sutures, skin glue, or adhesive strips loosen or come off sooner than expected. Get help right away if: You have bleeding that does not stop with pressure or a dressing. Summary After the procedure, it is common to have some soreness, bruising, and itching at the site. Follow instructions from your health care provider about how to take care of your site. Check your site every day for signs of infection. Contact a health care provider if you have redness, swelling, or pain around your site, or your site feels warm to the touch. Keep all follow-up visits as told by your health care provider. This is important. This information is not intended to replace advice given to you by your health care provider. Make sure you discuss any questions you have with your health care provider. Document Released: 03/15/2015 Document Revised: 08/16/2017 Document Reviewed: 08/16/2017 Elsevier Interactive Patient Education  2019 [employer]  AMBULATORY SURGERY  DISCHARGE INSTRUCTIONS   The drugs that you were given will stay in your system until tomorrow so for the next 24 hours you should not:  Drive an automobile Make any legal decisions Drink any alcoholic beverage   You may resume regular meals tomorrow.  Today it is better to start with liquids and gradually work up to solid foods.  You may eat anything you prefer, but it is better to start with liquids, then soup   and crackers, and gradually work up to solid foods.   Please notify your doctor immediately if you have any unusual bleeding, trouble breathing, redness and pain at the surgery site, drainage, fever, or pain not relieved by medication.    Additional Instructions:        Please contact your physician with any problems or Same Day Surgery at [phone], Monday through Friday 6 am to 4 pm, or [place] at  [place] Main number at [phone].  

## 2022-05-28 NOTE — Anesthesia Preprocedure Evaluation (Addendum)
Anesthesia Evaluation  Patient identified by MRN, date of birth, ID band Patient awake    Reviewed: Allergy & Precautions, NPO status , Patient's Chart, lab work & pertinent test results  History of Anesthesia Complications (+) PONV and history of anesthetic complications  Airway Mallampati: IV   Neck ROM: Full    Dental no notable dental hx.    Pulmonary neg pulmonary ROS   Pulmonary exam normal breath sounds clear to auscultation       Cardiovascular Exercise Tolerance: Good Normal cardiovascular exam Rhythm:Regular Rate:Normal  ECG 05/22/22:  Normal sinus rhythm Low voltage QRS   Neuro/Psych  PSYCHIATRIC DISORDERS Anxiety Depression    negative neurological ROS     GI/Hepatic ,GERD  ,,  Endo/Other  Hypothyroidism  Obesity; prediabetes  Renal/GU negative Renal ROS     Musculoskeletal  (+) Arthritis ,    Abdominal   Peds  Hematology negative hematology ROS (+)   Anesthesia Other Findings   Reproductive/Obstetrics                             Anesthesia Physical Anesthesia Plan  ASA: 2  Anesthesia Plan: General   Post-op Pain Management:    Induction: Intravenous  PONV Risk Score and Plan: 4 or greater and Ondansetron, Dexamethasone, Treatment may vary due to age or medical condition and Scopolamine patch - Pre-op  Airway Management Planned: LMA  Additional Equipment:   Intra-op Plan:   Post-operative Plan: Extubation in OR  Informed Consent: I have reviewed the patients History and Physical, chart, labs and discussed the procedure including the risks, benefits and alternatives for the proposed anesthesia with the patient or authorized representative who has indicated his/her understanding and acceptance.     Dental advisory given  Plan Discussed with: CRNA  Anesthesia Plan Comments: (Patient consented for risks of anesthesia including but not limited to:  - adverse  reactions to medications - damage to eyes, teeth, lips or other oral mucosa - nerve damage due to positioning  - sore throat or hoarseness - damage to heart, brain, nerves, lungs, other parts of body or loss of life  Informed patient about role of CRNA in peri- and intra-operative care.  Patient voiced understanding.)        Anesthesia Quick Evaluation

## 2022-05-28 NOTE — Op Note (Signed)
Preoperative diagnosis:  PAPILLOMA, left breast  Postoperative diagnosis: same.   Procedure: RF tag-localized left breast lumpectomy  Anesthesia: GETA  Surgeon: Dr. Benjamine Sprague  Wound Classification: Clean  Indications: Patient is a 67 y.o. female with a nonpalpable left breast mass noted on mammography with core biopsy demonstrating papilloma requires RF localizer placement, lumpectomy   Specimen: left Breast mass,   Complications: None  Estimated Blood Loss: 73mL  Findings: 1. Specimen mammography shows marker and RF localizer on specimen 2. Pathology call refers gross examination of margins was negative. 3. No other palpable mass    Description of procedure: RF localization was performed by radiology prior to procedure. In the nuclear medicine suite, the subareolar region was injected with Tc-99 sulfur colloid the morning of procedure. Localization studies were reviewed. The patient was taken to the operating room and placed supine on the operating table, and after general anesthesia the left breast prepped and draped in the usual sterile fashion. A time-out was completed verifying correct patient, procedure, site, positioning, and implant(s) and/or special equipment prior to beginning this procedure.  By identifying the RF localizer, the probable trajectory and location of the mass was visualized. A skin incision was planned in such a way as to minimize the amount of dissection to reach the mass.  The skin incision was made after infusion of local. Flaps were raised and  Sharp and blunt dissection was then taken down to the mass, taking care to include the entire RF localizer and a margin of grossly normal tissue. The specimen was removed. The specimen was oriented with paint. Imaging reviewed and the entire target lesion had been resected, with biopsy clip and localizer within the specimen.Gross margin analysis by pathology confirmed all margins cleared on initial  inspection. Hemostasis achieved and the wound closed in layers with  interrupted sutures of 3-0 Vicryl in deep dermal layer and a running subcuticular suture of Monocryl 4-0, then dressed with dermabond. The patient tolerated the procedure well and was taken to the postanesthesia care unit in stable condition. Sponge and instrument count correct at end of procedure.

## 2022-05-28 NOTE — Progress Notes (Signed)
Oxycodone prescribed from Dr. Lysle Pearl post op for home care. Per patient oxycodone resulted in hallucinations with her surgery last fall. Notified Dr. Lysle Pearl and prescription is going to be changed to tramadol po.

## 2022-05-28 NOTE — Anesthesia Procedure Notes (Signed)
Procedure Name: LMA Insertion Date/Time: 05/28/2022 1:46 PM  Performed by: Niel Hummer, CRNAPre-anesthesia Checklist: Patient identified, Emergency Drugs available, Suction available and Patient being monitored Patient Re-evaluated:Patient Re-evaluated prior to induction Oxygen Delivery Method: Circle system utilized Preoxygenation: Pre-oxygenation with 100% oxygen Induction Type: IV induction LMA: LMA inserted LMA Size: 4.0 Number of attempts: 1 Dental Injury: Teeth and Oropharynx as per pre-operative assessment

## 2022-05-29 NOTE — Anesthesia Postprocedure Evaluation (Signed)
Anesthesia Post Note  Patient: Diane Walsh  Procedure(s) Performed: PARTIAL MASTECTOMY WITH RADIO FREQUENCY LOCALIZER (Left: Breast)  Patient location during evaluation: PACU Anesthesia Type: General Level of consciousness: awake and alert, oriented and patient cooperative Pain management: pain level controlled Vital Signs Assessment: post-procedure vital signs reviewed and stable Respiratory status: spontaneous breathing, nonlabored ventilation and respiratory function stable Cardiovascular status: blood pressure returned to baseline and stable Postop Assessment: adequate PO intake Anesthetic complications: no   No notable events documented.   Last Vitals:  Vitals:   05/28/22 1505 05/28/22 1514  BP:  (!) 142/79  Pulse: 72 67  Resp: 16 17  Temp:  (!) 36.1 C  SpO2: 98% 97%    Last Pain:  Vitals:   05/28/22 1514  TempSrc: Temporal  PainSc: 0-No pain                 Darrin Nipper

## 2022-06-01 LAB — SURGICAL PATHOLOGY

## 2022-08-04 ENCOUNTER — Ambulatory Visit: Payer: Medicare PPO | Admitting: Plastic Surgery

## 2022-08-04 ENCOUNTER — Encounter: Payer: Self-pay | Admitting: Plastic Surgery

## 2022-08-04 ENCOUNTER — Telehealth: Payer: Self-pay | Admitting: Plastic Surgery

## 2022-08-04 VITALS — BP 143/75 | HR 89 | Ht 64.0 in | Wt 236.2 lb

## 2022-08-04 DIAGNOSIS — M549 Dorsalgia, unspecified: Secondary | ICD-10-CM | POA: Insufficient documentation

## 2022-08-04 DIAGNOSIS — M542 Cervicalgia: Secondary | ICD-10-CM | POA: Diagnosis not present

## 2022-08-04 DIAGNOSIS — M545 Low back pain, unspecified: Secondary | ICD-10-CM | POA: Diagnosis not present

## 2022-08-04 DIAGNOSIS — F419 Anxiety disorder, unspecified: Secondary | ICD-10-CM

## 2022-08-04 DIAGNOSIS — Z6841 Body Mass Index (BMI) 40.0 and over, adult: Secondary | ICD-10-CM

## 2022-08-04 DIAGNOSIS — M546 Pain in thoracic spine: Secondary | ICD-10-CM

## 2022-08-04 DIAGNOSIS — R21 Rash and other nonspecific skin eruption: Secondary | ICD-10-CM

## 2022-08-04 DIAGNOSIS — G8929 Other chronic pain: Secondary | ICD-10-CM

## 2022-08-04 DIAGNOSIS — N62 Hypertrophy of breast: Secondary | ICD-10-CM | POA: Diagnosis not present

## 2022-08-04 NOTE — Progress Notes (Signed)
Patient ID: Diane Walsh, female    DOB: 1955/08/03, 67 y.o.   MRN: [id number]   Chief Complaint  Patient presents with   Advice Only   Breast Problem    Mammary Hyperplasia: The patient is a 67 y.o. female with a history of mammary hyperplasia for several years.  She has extremely large breasts causing symptoms that include the following: Back pain in the upper and lower back, including neck pain. She pulls or pins her bra straps to provide better lift and relief of the pressure and pain. She notices relief by holding her breast up manually.  Her shoulder straps cause grooves and pain and pressure that requires padding for relief. Pain medication is sometimes required with motrin and tylenol.  Activities that are hindered by enlarged breasts include: exercise and running.  She has tried supportive clothing as well as fitted bras without improvement.  Her breasts are extremely large and fairly symmetric.  She has hyperpigmentation of the inframammary area on both sides.  The sternal to nipple distance on the right is 38 cm and the left is 38 cm.  The IMF distance is 24 cm.  She is 5 feet 4 inches tall and weighs 233 pounds.  The BMI = 40 kg/m.  Preoperative bra size = 42H cup.  The estimated excess breast tissue to be removed at the time of surgery = 820 grams on the left and 820 grams on the right.  Mammogram history: 05/2022 irregular and had a lumpectomy which was negative.  Family history of breast cancer:  no.  Tobacco use:  none.   The patient expresses the desire to pursue surgical intervention.  The patient's past medical hist 3 includes anxiety, arthritis, asthma, depression, thyroid disease and severe postop nausea.  Her past surgical history includes left breast biopsy, hand surgery, cholecystectomy knee surgery and tonsillectomy. She has grade III ptosis of the breasts.     Review of Systems  Constitutional:  Positive for activity change.  HENT: Negative.    Eyes: Negative.    Respiratory: Negative.    Cardiovascular: Negative.   Gastrointestinal: Negative.   Endocrine: Negative.   Genitourinary: Negative.   Musculoskeletal:  Positive for back pain and neck pain.  Skin:  Positive for rash.    Past Medical History:  Diagnosis Date   Allergic rhinitis    Anxiety    Arthritis    Asthma    Chickenpox    Depression    GERD (gastroesophageal reflux disease)    Hypothyroidism    Pneumonia    PONV (postoperative nausea and vomiting)    Pre-diabetes    Stress incontinence    Thyroid disease    UTI (urinary tract infection)     Past Surgical History:  Procedure Laterality Date   BREAST BIOPSY Right 11/28/2014   stereotactic biopsy - negative   BREAST BIOPSY Left 05/12/2022   stereo bx, CALC, X clip-path pending   BREAST BIOPSY Left 05/12/2022   MM LT BREAST BX W LOC DEV 1ST LESION IMAGE BX SPEC STEREO GUIDE 05/12/2022 [facility]   BREAST BIOPSY Left 05/26/2022   [place] LT RADIO FREQUENCY TAG LOC US GUIDE 05/26/2022 [facility]   CHOLECYSTECTOMY     COLONOSCOPY WITH PROPOFOL N/A 10/21/2020   Procedure: COLONOSCOPY WITH PROPOFOL;  Surgeon: Toledo, Boykin Nearing, MD;  Location: [facility];  Service: Gastroenterology;  Laterality: N/A;   HAND SURGERY Right 02/12/2015   Dr. Amanda Pea; [facility]   KNEE ARTHROSCOPY Left  REFRACTIVE SURGERY Bilateral    TONSILLECTOMY     TOTAL KNEE ARTHROPLASTY Left 01/06/2022   Procedure: TOTAL KNEE ARTHROPLASTY;  Surgeon: Teryl Lucy, MD;  Location: [facility];  Service: Orthopedics;  Laterality: Left;      Current Outpatient Medications:    acetaminophen (TYLENOL) 500 MG tablet, Take 1,000 mg by mouth every 6 (six) hours as needed for mild pain., Disp: , Rfl:    albuterol (PROAIR HFA) 108 (90 Base) MCG/ACT inhaler, Inhale 1-2 puffs into the lungs every 4 (four) hours as needed for wheezing or shortness of breath., Disp: 1 Inhaler, Rfl: 10   aspirin EC 325 MG tablet, Take 1 tablet (325 mg total) by  mouth 2 (two) times daily., Disp: 60 tablet, Rfl: 0   celecoxib (CELEBREX) 200 MG capsule, Take 200 mg by mouth daily., Disp: , Rfl:    fluticasone furoate-vilanterol (BREO ELLIPTA) 100-25 MCG/INH AEPB, Inhale 1 puff into the lungs as needed. TAKE 1 PUFF BY MOUTH EVERY DAY, Disp: , Rfl:    ibuprofen (ADVIL) 400 MG tablet, Take 1 tablet (400 mg total) by mouth every 8 (eight) hours as needed for mild pain or moderate pain., Disp: 30 tablet, Rfl: 0   levothyroxine (SYNTHROID) 100 MCG tablet, Take 100 mcg by mouth daily before breakfast., Disp: , Rfl:    montelukast (SINGULAIR) 10 MG tablet, Take 1 tablet (10 mg total) by mouth daily as needed (allergies)., Disp: , Rfl:    neomycin-bacitracin-polymyxin (NEOSPORIN) OINT, Apply 1 Application topically as needed for wound care or irritation., Disp: , Rfl:    omeprazole (PRILOSEC) 40 MG capsule, Take 40 mg by mouth daily., Disp: , Rfl:    sertraline (ZOLOFT) 100 MG tablet, Take 1 tablet (100 mg total) by mouth every evening., Disp: , Rfl:    traMADol (ULTRAM) 50 MG tablet, Take 1 tablet (50 mg total) by mouth every 6 (six) hours as needed for up to 6 doses., Disp: 6 tablet, Rfl: 0   Objective:   There were no vitals filed for this visit.  Physical Exam Vitals and nursing note reviewed.  Constitutional:      Appearance: Normal appearance.  HENT:     Head: Normocephalic and atraumatic.  Cardiovascular:     Rate and Rhythm: Normal rate.     Pulses: Normal pulses.  Pulmonary:     Effort: Pulmonary effort is normal.  Abdominal:     General: There is no distension.     Palpations: Abdomen is soft.  Musculoskeletal:        General: No deformity.  Skin:    General: Skin is warm.  Neurological:     Mental Status: She is alert and oriented to person, place, and time.  Psychiatric:        Mood and Affect: Mood normal.        Behavior: Behavior normal.        Thought Content: Thought content normal.        Judgment: Judgment normal.     Assessment & Plan:  Anxiety and depression  Symptomatic mammary hypertrophy  Chronic bilateral thoracic back pain  Neck pain  The procedure the patient selected and that was best for the patient was discussed. The risk were discussed and include but not limited to the following:  Breast asymmetry, fluid accumulation, firmness of the breast, inability to breast feed, loss of nipple or areola, skin loss, change in skin and nipple sensation, fat necrosis of the breast tissue, bleeding, infection and healing delay.  There are risks of anesthesia and injury to nerves or blood vessels.  Allergic reaction to tape, suture and skin glue are possible.  There will be swelling.  Any of these can lead to the need for revisional surgery which is not included in this surgery.  A breast reduction has potential to interfere with diagnostic procedures in the future.  This procedure is best done when the breast is fully developed.  Changes in the breast will continue to occur over time: pregnancy, weight gain or weigh loss. No guarantees are given for a certain bra or breast size.    Total time: 40 minutes. This includes time spent with the patient during the visit as well as time spent before and after the visit reviewing the chart, documenting the encounter, ordering pertinent studies and literature for the patient.    Mammogram:  done  The patient is a good candidate for bilateral breast reduction with possible liposuction.   Pictures were obtained of the patient and placed in the chart with the patient's or guardian's permission.   Alena Bills Caree Wolpert, DO

## 2022-08-04 NOTE — Telephone Encounter (Signed)
Pt is looking to wait until Aug or Sept to have sx. She stated she has a few apts coming up, daughter just had a baby and wants to be able to help throughout the summer picking up the baby.

## 2022-08-05 ENCOUNTER — Telehealth: Payer: Self-pay

## 2022-08-05 NOTE — Telephone Encounter (Signed)
Faxed records request to [facility] in [place]. Received fax success confirmation. Forwarding ROI form to front desk for batch scanning.

## 2022-08-05 NOTE — Telephone Encounter (Signed)
Faxed records request to [facility] in [place]. Received fax success confirmation. Forwarded ROI form to front desk for batch scanning.

## 2022-08-10 ENCOUNTER — Ambulatory Visit (INDEPENDENT_AMBULATORY_CARE_PROVIDER_SITE_OTHER): Payer: Self-pay

## 2022-08-10 DIAGNOSIS — Z719 Counseling, unspecified: Secondary | ICD-10-CM

## 2022-08-14 ENCOUNTER — Telehealth: Payer: Self-pay

## 2022-08-14 NOTE — Telephone Encounter (Signed)
Received medical records from [facility] Will scan into chart.

## 2022-08-19 ENCOUNTER — Other Ambulatory Visit: Payer: Self-pay | Admitting: Surgery

## 2022-08-19 DIAGNOSIS — Z853 Personal history of malignant neoplasm of breast: Secondary | ICD-10-CM

## 2022-11-10 ENCOUNTER — Encounter: Payer: Medicare PPO | Admitting: Plastic Surgery

## 2022-11-10 ENCOUNTER — Ambulatory Visit
Admission: RE | Admit: 2022-11-10 | Discharge: 2022-11-10 | Disposition: A | Discharge status: Home / Self Care | Payer: Medicare PPO | Source: Ambulatory Visit | Attending: Surgery

## 2022-11-10 ENCOUNTER — Ambulatory Visit
Admission: RE | Admit: 2022-11-10 | Discharge: 2022-11-10 | Disposition: A | Discharge status: Home / Self Care | Payer: Medicare PPO | Source: Ambulatory Visit | Attending: Surgery | Admitting: Surgery

## 2022-11-10 DIAGNOSIS — Z853 Personal history of malignant neoplasm of breast: Secondary | ICD-10-CM

## 2022-11-17 ENCOUNTER — Encounter: Payer: Self-pay | Admitting: Student

## 2022-11-17 ENCOUNTER — Ambulatory Visit (INDEPENDENT_AMBULATORY_CARE_PROVIDER_SITE_OTHER): Payer: Medicare PPO | Admitting: Student

## 2022-11-17 VITALS — BP 138/71 | HR 81

## 2022-11-17 DIAGNOSIS — N62 Hypertrophy of breast: Secondary | ICD-10-CM

## 2022-11-17 MED ORDER — ONDANSETRON HCL 4 MG PO TABS: 4.0000 mg | ORAL_TABLET | Freq: Three times a day (TID) | ORAL | 0 refills | Status: DC | PRN

## 2022-11-17 MED ORDER — TRAMADOL HCL 50 MG PO TABS: 50.0000 mg | ORAL_TABLET | Freq: Three times a day (TID) | ORAL | 0 refills | Status: DC | PRN

## 2022-11-17 MED ORDER — CEPHALEXIN 500 MG PO CAPS: 500.0000 mg | ORAL_CAPSULE | Freq: Four times a day (QID) | ORAL | 0 refills | Status: AC

## 2022-11-17 NOTE — Progress Notes (Signed)
Patient ID: Diane Walsh, female    DOB: 12/20/1955, 68 y.o.   MRN: [id number]  Chief Complaint  Patient presents with   Pre-op Exam      ICD-10-CM   1. Symptomatic mammary hypertrophy  N62        History of Present Illness: Diane Walsh is a 67 y.o.  female  with a history of macromastia.  She presents for preoperative evaluation for upcoming procedure, Bilateral Breast Reduction, scheduled for 12/03/2022 with Dr.  Ulice Bold  The patient has not had problems with anesthesia.  Patient reports she had a mammogram on 11/10/2022, she states this was negative.  Patient denies any personal history of breast cancer.  She does reports she has a history of breast cancer in 2 of her first cousins.  She denies any history of cardiac disease.  She denies taking any blood thinners.  She denies smoking.  Patient denies taking any birth control or hormone replacement.  She does report history of 1 miscarriage.  She denies any personal family history of blood clots or clotting diseases.  She denies any recent surgeries, traumas.  She states that she does have vertigo and fell about 1 month ago.  She denies any episodes since then.  She denies any trauma when she fell.  She denies any history of stroke or heart attack.  She denies any history of Crohn's disease or ulcerative colitis.  She denies any history of COPD or asthma.  She denies any history of cancer.  She denies any varicosities to her lower extremities.  She denies any recent fevers or chills.  Patient reports she is currently a 63 age, she states that she would just like to be smaller and have some less pain, but is thinking she would like to be around a D cup.  Did discuss with patient that cup size cannot be guaranteed.  Patient expressed understanding.  Summary of Previous Visit: Patient was seen for consult by Dr. Ulice Bold on 08/04/2022.  At this visit, patient reported upper back and neck pain related to her enlarged breasts.  On exam,  her STN bilaterally was 38 cm.  Her BMI was 40 kg/m and her preoperative bra size was 42H cup.  The estimated amount of excess breast tissue to be removed at the time of surgery was 820 g bilaterally.  Estimated excess breast tissue to be removed at time of surgery: 820 grams  Job: Retired, but works part-time at an [employer], planning to take 1 week off.  PMH Significant for: Acid reflux, allergic rhinitis, BPPV, anxiety, depression, macromastia   Past Medical History: Allergies: No Known Allergies  Current Medications:  Current Outpatient Medications:    albuterol (PROAIR HFA) 108 (90 Base) MCG/ACT inhaler, Inhale 1-2 puffs into the lungs every 4 (four) hours as needed for wheezing or shortness of breath., Disp: 1 Inhaler, Rfl: 10   celecoxib (CELEBREX) 200 MG capsule, Take 200 mg by mouth daily., Disp: , Rfl:    fluticasone furoate-vilanterol (BREO ELLIPTA) 100-25 MCG/INH AEPB, Inhale 1 puff into the lungs as needed. TAKE 1 PUFF BY MOUTH EVERY DAY, Disp: , Rfl:    levothyroxine (SYNTHROID) 112 MCG tablet, ake 1 tablet (112 mcg total) by mouth once daily Take on an empty stomach with a glass of water at least 30-60 minutes before breakfast., Disp: , Rfl:    montelukast (SINGULAIR) 10 MG tablet, Take 1 tablet (10 mg total) by mouth daily as needed (allergies)., Disp: ,  Rfl:    omeprazole (PRILOSEC) 40 MG capsule, Take 40 mg by mouth daily., Disp: , Rfl:    sertraline (ZOLOFT) 100 MG tablet, Take 1 tablet (100 mg total) by mouth every evening., Disp: , Rfl:    amoxicillin (AMOXIL) 500 MG capsule, Only take before dental procedures (Patient not taking: Reported on 11/17/2022), Disp: , Rfl:   Past Medical Problems: Past Medical History:  Diagnosis Date   Allergic rhinitis    Anxiety    Arthritis    Asthma    Chickenpox    Depression    GERD (gastroesophageal reflux disease)    Hypothyroidism    Pneumonia    PONV (postoperative nausea and vomiting)    Pre-diabetes     Stress incontinence    Thyroid disease    UTI (urinary tract infection)     Past Surgical History: Past Surgical History:  Procedure Laterality Date   BREAST BIOPSY Right 11/28/2014   stereotactic biopsy - negative   BREAST BIOPSY Left 05/12/2022   stereo bx, CALC, X clip-papilloma   BREAST BIOPSY Left 05/12/2022   MM LT BREAST BX W LOC DEV 1ST LESION IMAGE BX SPEC STEREO GUIDE 05/12/2022 [facility]   BREAST BIOPSY Left 05/26/2022   [place] LT RADIO FREQUENCY TAG LOC US GUIDE 05/26/2022 [facility]   CHOLECYSTECTOMY     COLONOSCOPY WITH PROPOFOL N/A 10/21/2020   Procedure: COLONOSCOPY WITH PROPOFOL;  Surgeon: Toledo, Boykin Nearing, MD;  Location: [facility];  Service: Gastroenterology;  Laterality: N/A;   HAND SURGERY Right 02/12/2015   Dr. 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   Procedure: TOTAL KNEE ARTHROPLASTY;  Surgeon: Teryl Lucy, MD;  Location: [facility];  Service: Orthopedics;  Laterality: Left;    Social History: Social History   Socioeconomic History   Marital status: Married    Spouse name: Zenaida Niece   Number of children: 2   Years of education: College   Highest education level: Not on file  Occupational History   Occupation: Full Time  Tobacco Use   Smoking status: Never   Smokeless tobacco: Never  Vaping Use   Vaping status: Never Used  Substance and Sexual Activity   Alcohol use: Yes    Comment: Very Rarely   Drug use: No   Sexual activity: Not on file  Other Topics Concern   Not on file  Social History Narrative   Not on file   Social Determinants of Health   Financial Resource Strain: Patient Declined (07/07/2022)   Received from [facility], [facility]   Overall Financial Resource Strain (CARDIA)    Difficulty of Paying Living Expenses: Patient declined  Food Insecurity: Patient Declined (07/07/2022)    Received from [facility], [facility]   Hunger Vital Sign    Worried About Running Out of Food in the Last Year: Patient declined    Ran Out of Food in the Last Year: Patient declined  Transportation Needs: Patient Declined (07/07/2022)   Received from [facility], [facility]   PRAPARE - Transportation    In the past 12 months, has lack of transportation kept you from medical appointments or from getting medications?: Patient declined    Lack of Transportation (Non-Medical): Patient declined  Physical Activity: Not on file  Stress: Not on file  Social Connections: Not on file  Intimate  Partner Violence: Not At Risk (01/06/2022)   Humiliation, Afraid, Rape, and Kick questionnaire    Fear of Current or Ex-Partner: No    Emotionally Abused: No    Physically Abused: No    Sexually Abused: No    Family History: Family History  Problem Relation Age of Onset   Hypothyroidism Mother    Dementia Mother    Hypertension Father    Diabetes Father    Hypertension Sister    Breast cancer Cousin    Breast cancer Cousin     Review of Systems: Denies any recent fevers or chills.  Physical Exam: Vital Signs BP 138/71 (BP Location: Right Arm, Patient Position: Sitting, Cuff Size: Large)   Pulse 81   SpO2 94%   Physical Exam  Constitutional:      General: Not in acute distress.    Appearance: Normal appearance. Not ill-appearing.  HENT:     Head: Normocephalic and atraumatic.  Neck:     Musculoskeletal: Normal range of motion.  Cardiovascular:     Rate and Rhythm: Normal rate    Pulses: Normal pulses.  Pulmonary:     Effort: Pulmonary effort is normal. No respiratory distress.  Musculoskeletal: Normal range of motion.  Skin:    General: Skin is warm and dry.     Findings: No erythema or rash.  Neurological:     Mental Status: Alert and oriented to person, place, and time. Mental status is at baseline.   Psychiatric:        Mood and Affect: Mood normal.        Behavior: Behavior normal.    Assessment/Plan: The patient is scheduled for bilateral breast reduction with Dr. Ulice Bold.  Risks, benefits, and alternatives of procedure discussed, questions answered and consent obtained.    Smoking Status: Non-smoker; Counseling Given?  N/A Last Mammogram: 11/10/2022; Results: BI-RADS Category 2  Caprini Score: 5; Risk Factors include: Age, BMI > 25, and length of planned surgery. Recommendation for mechanical prophylaxis. Encourage early ambulation.   Pictures obtained: @consult   Post-op Rx sent to pharmacy:  Tramadol, Zofran, Keflex  -patient states that she does not do well with oxycodone and prefers tramadol.  Instructed patient to hold her Celebrex at least 1 week prior to surgery, and hold any sort of NSAID 1 week prior to surgery.  Patient expressed understanding.  Patient was provided with the breast reduction and General Surgical Risk consent document and Pain Medication Agreement prior to their appointment.  They had adequate time to read through the risk consent documents and Pain Medication Agreement. We also discussed them in person together during this preop appointment. All of their questions were answered to their satisfaction.  Recommended calling if they have any further questions.  Risk consent form and Pain Medication Agreement to be scanned into patient's chart.  The risk that can be encountered with breast reduction were discussed and include the following but not limited to these:  Breast asymmetry, fluid accumulation, firmness of the breast, inability to breast feed, loss of nipple or areola, skin loss, decrease or no nipple sensation, fat necrosis of the breast tissue, bleeding, infection, healing delay.  There are risks of anesthesia, changes to skin sensation and injury to nerves or blood vessels.  The muscle can be temporarily or permanently injured.  You may have an  allergic reaction to tape, suture, glue, blood products which can result in skin discoloration, swelling, pain, skin lesions, poor healing.  Any of these can lead to the need  for revisonal surgery or stage procedures.  A reduction has potential to interfere with diagnostic procedures.  Nipple or breast piercing can increase risks of infection.  This procedure is best done when the breast is fully developed.  Changes in the breast will continue to occur over time.  Pregnancy can alter the outcomes of previous breast reduction surgery, weight gain and weigh loss can also effect the long term appearance.     Electronically signed by: Laurena Spies, PA-C 11/17/2022 3:56 PM

## 2022-11-17 NOTE — H&P (View-Only) (Signed)
Patient ID: Diane Walsh, female    DOB: 12/20/1955, 68 y.o.   MRN: [id number]  Chief Complaint  Patient presents with   Pre-op Exam      ICD-10-CM   1. Symptomatic mammary hypertrophy  N62        History of Present Illness: Diane Walsh is a 67 y.o.  female  with a history of macromastia.  She presents for preoperative evaluation for upcoming procedure, Bilateral Breast Reduction, scheduled for 12/03/2022 with Dr.  Ulice Bold  The patient has not had problems with anesthesia.  Patient reports she had a mammogram on 11/10/2022, she states this was negative.  Patient denies any personal history of breast cancer.  She does reports she has a history of breast cancer in 2 of her first cousins.  She denies any history of cardiac disease.  She denies taking any blood thinners.  She denies smoking.  Patient denies taking any birth control or hormone replacement.  She does report history of 1 miscarriage.  She denies any personal family history of blood clots or clotting diseases.  She denies any recent surgeries, traumas.  She states that she does have vertigo and fell about 1 month ago.  She denies any episodes since then.  She denies any trauma when she fell.  She denies any history of stroke or heart attack.  She denies any history of Crohn's disease or ulcerative colitis.  She denies any history of COPD or asthma.  She denies any history of cancer.  She denies any varicosities to her lower extremities.  She denies any recent fevers or chills.  Patient reports she is currently a 63 age, she states that she would just like to be smaller and have some less pain, but is thinking she would like to be around a D cup.  Did discuss with patient that cup size cannot be guaranteed.  Patient expressed understanding.  Summary of Previous Visit: Patient was seen for consult by Dr. Ulice Bold on 08/04/2022.  At this visit, patient reported upper back and neck pain related to her enlarged breasts.  On exam,  her STN bilaterally was 38 cm.  Her BMI was 40 kg/m and her preoperative bra size was 42H cup.  The estimated amount of excess breast tissue to be removed at the time of surgery was 820 g bilaterally.  Estimated excess breast tissue to be removed at time of surgery: 820 grams  Job: Retired, but works part-time at an [employer], planning to take 1 week off.  PMH Significant for: Acid reflux, allergic rhinitis, BPPV, anxiety, depression, macromastia   Past Medical History: Allergies: No Known Allergies  Current Medications:  Current Outpatient Medications:    albuterol (PROAIR HFA) 108 (90 Base) MCG/ACT inhaler, Inhale 1-2 puffs into the lungs every 4 (four) hours as needed for wheezing or shortness of breath., Disp: 1 Inhaler, Rfl: 10   celecoxib (CELEBREX) 200 MG capsule, Take 200 mg by mouth daily., Disp: , Rfl:    fluticasone furoate-vilanterol (BREO ELLIPTA) 100-25 MCG/INH AEPB, Inhale 1 puff into the lungs as needed. TAKE 1 PUFF BY MOUTH EVERY DAY, Disp: , Rfl:    levothyroxine (SYNTHROID) 112 MCG tablet, ake 1 tablet (112 mcg total) by mouth once daily Take on an empty stomach with a glass of water at least 30-60 minutes before breakfast., Disp: , Rfl:    montelukast (SINGULAIR) 10 MG tablet, Take 1 tablet (10 mg total) by mouth daily as needed (allergies)., Disp: ,  Rfl:    omeprazole (PRILOSEC) 40 MG capsule, Take 40 mg by mouth daily., Disp: , Rfl:    sertraline (ZOLOFT) 100 MG tablet, Take 1 tablet (100 mg total) by mouth every evening., Disp: , Rfl:    amoxicillin (AMOXIL) 500 MG capsule, Only take before dental procedures (Patient not taking: Reported on 11/17/2022), Disp: , Rfl:   Past Medical Problems: Past Medical History:  Diagnosis Date   Allergic rhinitis    Anxiety    Arthritis    Asthma    Chickenpox    Depression    GERD (gastroesophageal reflux disease)    Hypothyroidism    Pneumonia    PONV (postoperative nausea and vomiting)    Pre-diabetes     Stress incontinence    Thyroid disease    UTI (urinary tract infection)     Past Surgical History: Past Surgical History:  Procedure Laterality Date   BREAST BIOPSY Right 11/28/2014   stereotactic biopsy - negative   BREAST BIOPSY Left 05/12/2022   stereo bx, CALC, X clip-papilloma   BREAST BIOPSY Left 05/12/2022   MM LT BREAST BX W LOC DEV 1ST LESION IMAGE BX SPEC STEREO GUIDE 05/12/2022 [facility]   BREAST BIOPSY Left 05/26/2022   [place] LT RADIO FREQUENCY TAG LOC US GUIDE 05/26/2022 [facility]   CHOLECYSTECTOMY     COLONOSCOPY WITH PROPOFOL N/A 10/21/2020   Procedure: COLONOSCOPY WITH PROPOFOL;  Surgeon: Toledo, Boykin Nearing, MD;  Location: [facility];  Service: Gastroenterology;  Laterality: N/A;   HAND SURGERY Right 02/12/2015   Dr. 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   Procedure: TOTAL KNEE ARTHROPLASTY;  Surgeon: Teryl Lucy, MD;  Location: [facility];  Service: Orthopedics;  Laterality: Left;    Social History: Social History   Socioeconomic History   Marital status: Married    Spouse name: Zenaida Niece   Number of children: 2   Years of education: College   Highest education level: Not on file  Occupational History   Occupation: Full Time  Tobacco Use   Smoking status: Never   Smokeless tobacco: Never  Vaping Use   Vaping status: Never Used  Substance and Sexual Activity   Alcohol use: Yes    Comment: Very Rarely   Drug use: No   Sexual activity: Not on file  Other Topics Concern   Not on file  Social History Narrative   Not on file   Social Determinants of Health   Financial Resource Strain: Patient Declined (07/07/2022)   Received from [facility], [facility]   Overall Financial Resource Strain (CARDIA)    Difficulty of Paying Living Expenses: Patient declined  Food Insecurity: Patient Declined (07/07/2022)    Received from [facility], [facility]   Hunger Vital Sign    Worried About Running Out of Food in the Last Year: Patient declined    Ran Out of Food in the Last Year: Patient declined  Transportation Needs: Patient Declined (07/07/2022)   Received from [facility], [facility]   PRAPARE - Transportation    In the past 12 months, has lack of transportation kept you from medical appointments or from getting medications?: Patient declined    Lack of Transportation (Non-Medical): Patient declined  Physical Activity: Not on file  Stress: Not on file  Social Connections: Not on file  Intimate  Partner Violence: Not At Risk (01/06/2022)   Humiliation, Afraid, Rape, and Kick questionnaire    Fear of Current or Ex-Partner: No    Emotionally Abused: No    Physically Abused: No    Sexually Abused: No    Family History: Family History  Problem Relation Age of Onset   Hypothyroidism Mother    Dementia Mother    Hypertension Father    Diabetes Father    Hypertension Sister    Breast cancer Cousin    Breast cancer Cousin     Review of Systems: Denies any recent fevers or chills.  Physical Exam: Vital Signs BP 138/71 (BP Location: Right Arm, Patient Position: Sitting, Cuff Size: Large)   Pulse 81   SpO2 94%   Physical Exam  Constitutional:      General: Not in acute distress.    Appearance: Normal appearance. Not ill-appearing.  HENT:     Head: Normocephalic and atraumatic.  Neck:     Musculoskeletal: Normal range of motion.  Cardiovascular:     Rate and Rhythm: Normal rate    Pulses: Normal pulses.  Pulmonary:     Effort: Pulmonary effort is normal. No respiratory distress.  Musculoskeletal: Normal range of motion.  Skin:    General: Skin is warm and dry.     Findings: No erythema or rash.  Neurological:     Mental Status: Alert and oriented to person, place, and time. Mental status is at baseline.   Psychiatric:        Mood and Affect: Mood normal.        Behavior: Behavior normal.    Assessment/Plan: The patient is scheduled for bilateral breast reduction with Dr. Ulice Bold.  Risks, benefits, and alternatives of procedure discussed, questions answered and consent obtained.    Smoking Status: Non-smoker; Counseling Given?  N/A Last Mammogram: 11/10/2022; Results: BI-RADS Category 2  Caprini Score: 5; Risk Factors include: Age, BMI > 25, and length of planned surgery. Recommendation for mechanical prophylaxis. Encourage early ambulation.   Pictures obtained: @consult   Post-op Rx sent to pharmacy:  Tramadol, Zofran, Keflex  -patient states that she does not do well with oxycodone and prefers tramadol.  Instructed patient to hold her Celebrex at least 1 week prior to surgery, and hold any sort of NSAID 1 week prior to surgery.  Patient expressed understanding.  Patient was provided with the breast reduction and General Surgical Risk consent document and Pain Medication Agreement prior to their appointment.  They had adequate time to read through the risk consent documents and Pain Medication Agreement. We also discussed them in person together during this preop appointment. All of their questions were answered to their satisfaction.  Recommended calling if they have any further questions.  Risk consent form and Pain Medication Agreement to be scanned into patient's chart.  The risk that can be encountered with breast reduction were discussed and include the following but not limited to these:  Breast asymmetry, fluid accumulation, firmness of the breast, inability to breast feed, loss of nipple or areola, skin loss, decrease or no nipple sensation, fat necrosis of the breast tissue, bleeding, infection, healing delay.  There are risks of anesthesia, changes to skin sensation and injury to nerves or blood vessels.  The muscle can be temporarily or permanently injured.  You may have an  allergic reaction to tape, suture, glue, blood products which can result in skin discoloration, swelling, pain, skin lesions, poor healing.  Any of these can lead to the need  for revisonal surgery or stage procedures.  A reduction has potential to interfere with diagnostic procedures.  Nipple or breast piercing can increase risks of infection.  This procedure is best done when the breast is fully developed.  Changes in the breast will continue to occur over time.  Pregnancy can alter the outcomes of previous breast reduction surgery, weight gain and weigh loss can also effect the long term appearance.     Electronically signed by: Laurena Spies, PA-C 11/17/2022 3:56 PM

## 2022-11-27 ENCOUNTER — Encounter (HOSPITAL_BASED_OUTPATIENT_CLINIC_OR_DEPARTMENT_OTHER): Payer: Self-pay | Admitting: Plastic Surgery

## 2022-12-03 ENCOUNTER — Encounter (HOSPITAL_BASED_OUTPATIENT_CLINIC_OR_DEPARTMENT_OTHER): Payer: Self-pay | Admitting: Plastic Surgery

## 2022-12-03 ENCOUNTER — Ambulatory Visit (HOSPITAL_BASED_OUTPATIENT_CLINIC_OR_DEPARTMENT_OTHER)
Admission: RE | Admit: 2022-12-03 | Discharge: 2022-12-03 | Disposition: A | Discharge status: Home / Self Care | Payer: Medicare PPO | Attending: Plastic Surgery | Admitting: Plastic Surgery

## 2022-12-03 ENCOUNTER — Ambulatory Visit (HOSPITAL_BASED_OUTPATIENT_CLINIC_OR_DEPARTMENT_OTHER): Payer: Medicare PPO | Admitting: Anesthesiology

## 2022-12-03 ENCOUNTER — Encounter (HOSPITAL_BASED_OUTPATIENT_CLINIC_OR_DEPARTMENT_OTHER)
Admission: RE | Disposition: A | Discharge status: Home / Self Care | Payer: Self-pay | Source: Home / Self Care | Attending: Plastic Surgery

## 2022-12-03 ENCOUNTER — Other Ambulatory Visit: Payer: Self-pay

## 2022-12-03 DIAGNOSIS — E669 Obesity, unspecified: Secondary | ICD-10-CM | POA: Diagnosis not present

## 2022-12-03 DIAGNOSIS — K219 Gastro-esophageal reflux disease without esophagitis: Secondary | ICD-10-CM | POA: Diagnosis not present

## 2022-12-03 DIAGNOSIS — Z01818 Encounter for other preprocedural examination: Secondary | ICD-10-CM

## 2022-12-03 DIAGNOSIS — J449 Chronic obstructive pulmonary disease, unspecified: Secondary | ICD-10-CM | POA: Diagnosis not present

## 2022-12-03 DIAGNOSIS — M542 Cervicalgia: Secondary | ICD-10-CM | POA: Insufficient documentation

## 2022-12-03 DIAGNOSIS — N62 Hypertrophy of breast: Secondary | ICD-10-CM | POA: Diagnosis present

## 2022-12-03 DIAGNOSIS — E039 Hypothyroidism, unspecified: Secondary | ICD-10-CM | POA: Diagnosis not present

## 2022-12-03 DIAGNOSIS — Z6838 Body mass index (BMI) 38.0-38.9, adult: Secondary | ICD-10-CM | POA: Insufficient documentation

## 2022-12-03 DIAGNOSIS — M549 Dorsalgia, unspecified: Secondary | ICD-10-CM | POA: Insufficient documentation

## 2022-12-03 HISTORY — PX: BREAST REDUCTION SURGERY: SHX8

## 2022-12-03 SURGERY — MAMMOPLASTY, REDUCTION
Anesthesia: General | Site: Breast | Laterality: Bilateral

## 2022-12-03 MED ORDER — OXYCODONE HCL 5 MG/5ML PO SOLN: 5.0000 mg | Freq: Once | ORAL | Status: AC | PRN

## 2022-12-03 MED ORDER — BUPIVACAINE HCL (PF) 0.25 % IJ SOLN
INTRAMUSCULAR | Status: AC
  Filled 2022-12-03: qty 30

## 2022-12-03 MED ORDER — OXYCODONE HCL 5 MG PO TABS
ORAL_TABLET | ORAL | Status: AC
  Filled 2022-12-03: qty 1

## 2022-12-03 MED ORDER — CEFAZOLIN SODIUM-DEXTROSE 2-4 GM/100ML-% IV SOLN
2.0000 g | INTRAVENOUS | Status: AC
  Administered 2022-12-03: 2 g via INTRAVENOUS

## 2022-12-03 MED ORDER — PHENYLEPHRINE 80 MCG/ML (10ML) SYRINGE FOR IV PUSH (FOR BLOOD PRESSURE SUPPORT)
PREFILLED_SYRINGE | INTRAVENOUS | Status: DC | PRN
Start: 2022-12-03 — End: 2022-12-03
  Administered 2022-12-03 (×4): 80 ug via INTRAVENOUS

## 2022-12-03 MED ORDER — ACETAMINOPHEN 325 MG RE SUPP: 650.0000 mg | RECTAL | Status: DC | PRN

## 2022-12-03 MED ORDER — KETOROLAC TROMETHAMINE 15 MG/ML IJ SOLN: 15.0000 mg | Freq: Once | INTRAMUSCULAR | Status: DC | PRN

## 2022-12-03 MED ORDER — PHENYLEPHRINE HCL-NACL 20-0.9 MG/250ML-% IV SOLN
INTRAVENOUS | Status: DC | PRN
  Administered 2022-12-03: 80 ug/min via INTRAVENOUS

## 2022-12-03 MED ORDER — EPINEPHRINE PF 1 MG/ML IJ SOLN
INTRAMUSCULAR | Status: AC
  Filled 2022-12-03: qty 1

## 2022-12-03 MED ORDER — LIDOCAINE 2% (20 MG/ML) 5 ML SYRINGE
INTRAMUSCULAR | Status: AC
  Filled 2022-12-03: qty 5

## 2022-12-03 MED ORDER — MIDAZOLAM HCL 2 MG/2ML IJ SOLN
INTRAMUSCULAR | Status: AC
  Filled 2022-12-03: qty 2

## 2022-12-03 MED ORDER — ACETAMINOPHEN 325 MG PO TABS: 650.0000 mg | ORAL_TABLET | ORAL | Status: DC | PRN

## 2022-12-03 MED ORDER — ACETAMINOPHEN 10 MG/ML IV SOLN: 1000.0000 mg | Freq: Once | INTRAVENOUS | Status: DC | PRN

## 2022-12-03 MED ORDER — FENTANYL CITRATE (PF) 100 MCG/2ML IJ SOLN: 25.0000 ug | INTRAMUSCULAR | Status: DC | PRN

## 2022-12-03 MED ORDER — LIDOCAINE HCL 1 % IJ SOLN
INTRAVENOUS | Status: DC | PRN
  Administered 2022-12-03: 450 mL

## 2022-12-03 MED ORDER — SODIUM CHLORIDE 0.9% FLUSH: 3.0000 mL | INTRAVENOUS | Status: DC | PRN

## 2022-12-03 MED ORDER — ROCURONIUM BROMIDE 10 MG/ML (PF) SYRINGE
PREFILLED_SYRINGE | INTRAVENOUS | Status: DC | PRN
  Administered 2022-12-03: 20 mg via INTRAVENOUS
  Administered 2022-12-03: 40 mg via INTRAVENOUS

## 2022-12-03 MED ORDER — DEXAMETHASONE SODIUM PHOSPHATE 10 MG/ML IJ SOLN
INTRAMUSCULAR | Status: DC | PRN
  Administered 2022-12-03: 8 mg via INTRAVENOUS

## 2022-12-03 MED ORDER — SODIUM CHLORIDE (PF) 0.9 % IJ SOLN
INTRAMUSCULAR | Status: AC
  Filled 2022-12-03: qty 20

## 2022-12-03 MED ORDER — OXYCODONE HCL 5 MG PO TABS
5.0000 mg | ORAL_TABLET | Freq: Once | ORAL | Status: AC | PRN
  Administered 2022-12-03: 5 mg via ORAL

## 2022-12-03 MED ORDER — AMISULPRIDE (ANTIEMETIC) 5 MG/2ML IV SOLN: 10.0000 mg | Freq: Once | INTRAVENOUS | Status: DC | PRN

## 2022-12-03 MED ORDER — FENTANYL CITRATE (PF) 100 MCG/2ML IJ SOLN
INTRAMUSCULAR | Status: AC
  Filled 2022-12-03: qty 2

## 2022-12-03 MED ORDER — BUPIVACAINE LIPOSOME 1.3 % IJ SUSP
INTRAMUSCULAR | Status: AC
  Filled 2022-12-03: qty 20

## 2022-12-03 MED ORDER — ACETAMINOPHEN 10 MG/ML IV SOLN
INTRAVENOUS | Status: AC
  Filled 2022-12-03: qty 100

## 2022-12-03 MED ORDER — LIDOCAINE HCL (PF) 1 % IJ SOLN
INTRAMUSCULAR | Status: AC
  Filled 2022-12-03: qty 60

## 2022-12-03 MED ORDER — CEFAZOLIN SODIUM-DEXTROSE 2-4 GM/100ML-% IV SOLN
INTRAVENOUS | Status: AC
  Filled 2022-12-03: qty 100

## 2022-12-03 MED ORDER — PROPOFOL 10 MG/ML IV BOLUS
INTRAVENOUS | Status: DC | PRN
  Administered 2022-12-03: 20 mg via INTRAVENOUS
  Administered 2022-12-03: 150 mg via INTRAVENOUS

## 2022-12-03 MED ORDER — MIDAZOLAM HCL 5 MG/5ML IJ SOLN
INTRAMUSCULAR | Status: DC | PRN
  Administered 2022-12-03: 2 mg via INTRAVENOUS

## 2022-12-03 MED ORDER — LIDOCAINE HCL (PF) 1 % IJ SOLN
INTRAMUSCULAR | Status: AC
  Filled 2022-12-03: qty 30

## 2022-12-03 MED ORDER — ROCURONIUM BROMIDE 10 MG/ML (PF) SYRINGE
PREFILLED_SYRINGE | INTRAVENOUS | Status: AC
  Filled 2022-12-03: qty 10

## 2022-12-03 MED ORDER — BUPIVACAINE-EPINEPHRINE (PF) 0.5% -1:200000 IJ SOLN
INTRAMUSCULAR | Status: AC
  Filled 2022-12-03: qty 30

## 2022-12-03 MED ORDER — SODIUM CHLORIDE 0.9 % IV SOLN
INTRAVENOUS | Status: DC | PRN
  Administered 2022-12-03: 40 mL

## 2022-12-03 MED ORDER — ACETAMINOPHEN 10 MG/ML IV SOLN
INTRAVENOUS | Status: DC | PRN
  Administered 2022-12-03: 1000 mg via INTRAVENOUS

## 2022-12-03 MED ORDER — CHLORHEXIDINE GLUCONATE CLOTH 2 % EX PADS: 6.0000 | MEDICATED_PAD | Freq: Once | CUTANEOUS | Status: DC

## 2022-12-03 MED ORDER — SODIUM CHLORIDE 0.9 % IV SOLN: 250.0000 mL | INTRAVENOUS | Status: DC | PRN

## 2022-12-03 MED ORDER — ONDANSETRON HCL 4 MG/2ML IJ SOLN: 4.0000 mg | Freq: Once | INTRAMUSCULAR | Status: DC | PRN

## 2022-12-03 MED ORDER — KETAMINE HCL 50 MG/5ML IJ SOSY
PREFILLED_SYRINGE | INTRAMUSCULAR | Status: AC
  Filled 2022-12-03: qty 5

## 2022-12-03 MED ORDER — LIDOCAINE-EPINEPHRINE 1 %-1:100000 IJ SOLN
INTRAMUSCULAR | Status: DC | PRN
  Administered 2022-12-03: 50 mL via INTRAMUSCULAR

## 2022-12-03 MED ORDER — FENTANYL CITRATE (PF) 250 MCG/5ML IJ SOLN
INTRAMUSCULAR | Status: DC | PRN
  Administered 2022-12-03: 50 ug via INTRAVENOUS
  Administered 2022-12-03: 100 ug via INTRAVENOUS
  Administered 2022-12-03: 50 ug via INTRAVENOUS

## 2022-12-03 MED ORDER — DEXAMETHASONE SODIUM PHOSPHATE 10 MG/ML IJ SOLN
INTRAMUSCULAR | Status: AC
  Filled 2022-12-03: qty 1

## 2022-12-03 MED ORDER — LIDOCAINE 2% (20 MG/ML) 5 ML SYRINGE
INTRAMUSCULAR | Status: DC | PRN
  Administered 2022-12-03: 60 mg via INTRAVENOUS

## 2022-12-03 MED ORDER — LACTATED RINGERS IV SOLN: INTRAVENOUS | Status: DC

## 2022-12-03 MED ORDER — KETAMINE HCL 50 MG/5ML IJ SOSY
PREFILLED_SYRINGE | INTRAMUSCULAR | Status: DC | PRN
Start: 2022-12-03 — End: 2022-12-03
  Administered 2022-12-03: 40 mg via INTRAVENOUS
  Administered 2022-12-03: 10 mg via INTRAVENOUS

## 2022-12-03 MED ORDER — SUGAMMADEX SODIUM 200 MG/2ML IV SOLN
INTRAVENOUS | Status: DC | PRN
  Administered 2022-12-03: 200 mg via INTRAVENOUS

## 2022-12-03 MED ORDER — OXYCODONE HCL 5 MG PO TABS: 5.0000 mg | ORAL_TABLET | ORAL | Status: DC | PRN

## 2022-12-03 MED ORDER — ONDANSETRON HCL 4 MG/2ML IJ SOLN
INTRAMUSCULAR | Status: DC | PRN
  Administered 2022-12-03: 4 mg via INTRAVENOUS

## 2022-12-03 MED ORDER — LIDOCAINE-EPINEPHRINE 1 %-1:100000 IJ SOLN
INTRAMUSCULAR | Status: AC
  Filled 2022-12-03: qty 1

## 2022-12-03 MED ORDER — ONDANSETRON HCL 4 MG/2ML IJ SOLN
INTRAMUSCULAR | Status: AC
  Filled 2022-12-03: qty 2

## 2022-12-03 MED ORDER — DROPERIDOL 2.5 MG/ML IJ SOLN: 0.6250 mg | Freq: Once | INTRAMUSCULAR | Status: DC | PRN

## 2022-12-03 MED ORDER — SODIUM CHLORIDE 0.9% FLUSH: 3.0000 mL | Freq: Two times a day (BID) | INTRAVENOUS | Status: DC

## 2022-12-03 SURGICAL SUPPLY — 68 items
ADH SKN CLS APL DERMABOND .7 (GAUZE/BANDAGES/DRESSINGS) ×2
AGENT HMST PWDR BTL CLGN 5GM (Miscellaneous) ×1 IMPLANT
BAG DECANTER FOR FLEXI CONT (MISCELLANEOUS) IMPLANT
BINDER BREAST LRG (GAUZE/BANDAGES/DRESSINGS) IMPLANT
BINDER BREAST MEDIUM (GAUZE/BANDAGES/DRESSINGS) IMPLANT
BINDER BREAST XLRG (GAUZE/BANDAGES/DRESSINGS) IMPLANT
BINDER BREAST XXLRG (GAUZE/BANDAGES/DRESSINGS) IMPLANT
BIOPATCH RED 1 DISK 7.0 (GAUZE/BANDAGES/DRESSINGS) IMPLANT
BLADE HEX COATED 2.75 (ELECTRODE) ×1 IMPLANT
BLADE KNIFE PERSONA 10 (BLADE) ×2 IMPLANT
BLADE SURG 15 STRL LF DISP TIS (BLADE) IMPLANT
BLADE SURG 15 STRL SS (BLADE)
CANISTER SUCT 1200ML W/VALVE (MISCELLANEOUS) ×1 IMPLANT
CLEANSER WND VASHE 34 (WOUND CARE) IMPLANT
CLEANSER WND VASHE INSTL 34OZ (WOUND CARE) IMPLANT
COLLAGEN CELLERATERX 5 GRAM (Miscellaneous) IMPLANT
COVER BACK TABLE 60X90IN (DRAPES) ×1 IMPLANT
COVER MAYO STAND STRL (DRAPES) ×1 IMPLANT
DERMABOND ADVANCED .7 DNX12 (GAUZE/BANDAGES/DRESSINGS) ×2 IMPLANT
DRAIN CHANNEL 19F RND (DRAIN) IMPLANT
DRAPE LAPAROSCOPIC ABDOMINAL (DRAPES) ×1 IMPLANT
DRSG MEPILEX POST OP 4X8 (GAUZE/BANDAGES/DRESSINGS) ×2 IMPLANT
DRSG OPSITE POSTOP 4X12 (GAUZE/BANDAGES/DRESSINGS) IMPLANT
ELECT BLADE 4.0 EZ CLEAN MEGAD (MISCELLANEOUS) ×1
ELECT REM PT RETURN 9FT ADLT (ELECTROSURGICAL) ×1
ELECTRODE BLDE 4.0 EZ CLN MEGD (MISCELLANEOUS) ×1 IMPLANT
ELECTRODE REM PT RTRN 9FT ADLT (ELECTROSURGICAL) ×1 IMPLANT
EVACUATOR SILICONE 100CC (DRAIN) IMPLANT
GAUZE PAD ABD 8X10 STRL (GAUZE/BANDAGES/DRESSINGS) ×2 IMPLANT
GAUZE SPONGE 4X4 12PLY STRL LF (GAUZE/BANDAGES/DRESSINGS) IMPLANT
GLOVE BIO SURGEON STRL SZ 6.5 (GLOVE) ×3 IMPLANT
GLOVE BIOGEL PI IND STRL 7.0 (GLOVE) ×1 IMPLANT
GOWN STRL REUS W/ TWL LRG LVL3 (GOWN DISPOSABLE) ×2 IMPLANT
GOWN STRL REUS W/TWL LRG LVL3 (GOWN DISPOSABLE) ×2
NDL FILTER BLUNT 18X1 1/2 (NEEDLE) ×1 IMPLANT
NDL HYPO 25X1 1.5 SAFETY (NEEDLE) ×1 IMPLANT
NDL SAFETY ECLIP 18X1.5 (MISCELLANEOUS) IMPLANT
NEEDLE FILTER BLUNT 18X1 1/2 (NEEDLE) ×1
NEEDLE HYPO 25X1 1.5 SAFETY (NEEDLE) ×1
NS IRRIG 1000ML POUR BTL (IV SOLUTION) IMPLANT
PACK BASIN DAY SURGERY FS (CUSTOM PROCEDURE TRAY) ×1 IMPLANT
PAD ALCOHOL SWAB (MISCELLANEOUS) IMPLANT
PAD FOAM SILICONE BACKED (GAUZE/BANDAGES/DRESSINGS) IMPLANT
PENCIL SMOKE EVACUATOR (MISCELLANEOUS) ×1 IMPLANT
PIN SAFETY STERILE (MISCELLANEOUS) IMPLANT
SLEEVE SCD COMPRESS KNEE MED (STOCKING) ×1 IMPLANT
SPIKE FLUID TRANSFER (MISCELLANEOUS) IMPLANT
SPONGE T-LAP 18X18 ~~LOC~~+RFID (SPONGE) ×2 IMPLANT
STRIP SUTURE WOUND CLOSURE 1/2 (MISCELLANEOUS) ×2 IMPLANT
SUT MNCRL AB 4-0 PS2 18 (SUTURE) ×4 IMPLANT
SUT MON AB 3-0 SH 27 (SUTURE) ×7
SUT MON AB 3-0 SH27 (SUTURE) ×4 IMPLANT
SUT MON AB 5-0 PS2 18 (SUTURE) IMPLANT
SUT PDS 3-0 CT2 (SUTURE) ×9
SUT PDS II 3-0 CT2 27 ABS (SUTURE) ×6 IMPLANT
SUT SILK 3 0 PS 1 (SUTURE) IMPLANT
SYR 3ML 23GX1 SAFETY (SYRINGE) IMPLANT
SYR 50ML LL SCALE MARK (SYRINGE) IMPLANT
SYR BULB IRRIG 60ML STRL (SYRINGE) ×1 IMPLANT
SYR CONTROL 10ML LL (SYRINGE) ×1 IMPLANT
TAPE MEASURE VINYL STERILE (MISCELLANEOUS) IMPLANT
TOWEL GREEN STERILE FF (TOWEL DISPOSABLE) ×2 IMPLANT
TRAY DSU PREP LF (CUSTOM PROCEDURE TRAY) ×1 IMPLANT
TUBE CONNECTING 20X1/4 (TUBING) ×1 IMPLANT
TUBING INFILTRATION IT-10001 (TUBING) IMPLANT
TUBING SET GRADUATE ASPIR 12FT (MISCELLANEOUS) IMPLANT
UNDERPAD 30X36 HEAVY ABSORB (UNDERPADS AND DIAPERS) ×2 IMPLANT
YANKAUER SUCT BULB TIP NO VENT (SUCTIONS) ×1 IMPLANT

## 2022-12-03 NOTE — Anesthesia Postprocedure Evaluation (Signed)
Anesthesia Post Note  Patient: Diane Walsh  Procedure(s) Performed: MAMMARY REDUCTION  (BREAST) (Bilateral: Breast)     Patient location during evaluation: PACU Anesthesia Type: General Level of consciousness: awake Pain management: pain level controlled Vital Signs Assessment: post-procedure vital signs reviewed and stable Respiratory status: spontaneous breathing, nonlabored ventilation and respiratory function stable Cardiovascular status: blood pressure returned to baseline and stable Postop Assessment: no apparent nausea or vomiting Anesthetic complications: no   No notable events documented.  Last Vitals:  Vitals:   12/03/22 1117 12/03/22 1200  BP: (!) 161/86 (!) 160/82  Pulse: 87   Resp: 14   Temp: 36.9 C   SpO2: 94%     Last Pain:  Vitals:   12/03/22 1117  TempSrc:   PainSc: 5                  Fraser Busche P Jarelly Rinck

## 2022-12-03 NOTE — Discharge Instructions (Addendum)
Next dose of Tylenol anytime after 3:15 today if needed  INSTRUCTIONS FOR AFTER BREAST SURGERY   You will likely have some questions about what to expect following your operation.  The following information will help you and your family understand what to expect when you are discharged from the hospital.  It is important to follow these guidelines to help ensure a smooth recovery and reduce complication.  Postoperative instructions include information on: diet, wound care, medications and physical activity.  AFTER SURGERY Expect to go home after the procedure.  In some cases, you may need to spend one night in the hospital for observation.  DIET Breast surgery does not require a specific diet.  However, the healthier you eat the better your body will heal. It is important to increasing your protein intake.  This means limiting the foods with sugar and carbohydrates.  Focus on vegetables and some meat.  If you have liposuction during your procedure be sure to drink water.  If your urine is bright yellow, then it is concentrated, and you need to drink more water.  As a general rule after surgery, you should have 8 ounces of water every hour while awake.  If you find you are persistently nauseated or unable to take in liquids let us know.  NO TOBACCO USE or EXPOSURE.  This will slow your healing process and lead to a wound.  WOUND CARE Leave the binder on for 3 days . Use fragrance free soap like Dial, Dove or [place].   After 3 days you can remove the binder to shower. Once dry apply binder or sports bra. If you have liposuction you will have a soft and spongy dressing (Lipofoam) that helps prevent creases in your skin.  Remove before you shower and then replace it.  It is also available on [employer]. If you have steri-strips / tape directly attached to your skin leave them in place. It is OK to get these wet.   No baths, pools or hot tubs for four weeks. We close your incision to leave the smallest and  best-looking scar. No ointment or creams on your incisions for four weeks.  No Neosporin (Too many skin reactions).  A few weeks after surgery you can use Mederma and start massaging the scar. We ask you to wear your binder or sports bra for the first 6 weeks around the clock, including while sleeping. This provides added comfort and helps reduce the fluid accumulation at the surgery site. NO Ice or heating pads to the operative site.  You have a very high risk of a BURN before you feel the temperature change.  ACTIVITY No heavy lifting until cleared by the doctor.  This usually means no more than a half-gallon of milk.  It is OK to walk and climb stairs. Moving your legs is very important to decrease your risk of a blood clot.  It will also help keep you from getting deconditioned.  Every 1 to 2 hours get up and walk for 5 minutes. This will help with a quicker recovery back to normal.  Let pain be your guide so you don't do too much.  This time is for you to recover.  You will be more comfortable if you sleep and rest with your head elevated either with a few pillows under you or in a recliner.  No stomach sleeping for a three months.  WORK Everyone returns to work at different times. As a rough guide, most people take at least  1 - 2 weeks off prior to returning to work. If you need documentation for your job, give the forms to the front staff at the clinic.  DRIVING Arrange for someone to bring you home from the hospital after your surgery.  You may be able to drive a few days after surgery but not while taking any narcotics or valium.  BOWEL MOVEMENTS Constipation can occur after anesthesia and while taking pain medication.  It is important to stay ahead for your comfort.  We recommend taking Milk of Magnesia (2 tablespoons; twice a day) while taking the pain pills.  MEDICATIONS You may be prescribed should start after surgery At your preoperative visit for you history and physical you may have  been given the following medications: An antibiotic: Start this medication when you get home and take according to the instructions on the bottle. Zofran 4 mg:  This is to treat nausea and vomiting.  You can take this every 6 hours as needed and only if needed. Valium 2 mg for breast cancer patients: This is for muscle tightness if you have an implant or expander. This will help relax your muscle which also helps with pain control.  This can be taken every 12 hours as needed. Don't drive after taking this medication. Norco (hydrocodone/acetaminophen) 5/325 mg:  This is only to be used after you have taken the Motrin or the Tylenol. Every 8 hours as needed.   Over the counter Medication to take: Ibuprofen (Motrin) 600 mg:  Take this every 6 hours.  If you have additional pain then take 500 mg of the Tylenol every 8 hours.  Only take the Norco after you have tried these two. MiraLAX or Milk of Magnesia: Take this according to the bottle if you take the Norco.  WHEN TO CALL Call your surgeon's office if any of the following occur: Fever 101 degrees F or greater Excessive bleeding or fluid from the incision site. Pain that increases over time without aid from the medications Redness, warmth, or pus draining from incision sites Persistent nausea or inability to take in liquids Severe misshapen area that underwent the operation.    Post Anesthesia Home Care Instructions  Activity: Get plenty of rest for the remainder of the day. A responsible individual must stay with you for 24 hours following the procedure.  For the next 24 hours, DO NOT: -Drive a car -Advertising copywriter -Drink alcoholic beverages -Take any medication unless instructed by your physician -Make any legal decisions or sign important papers.  Meals: Start with liquid foods such as gelatin or soup. Progress to regular foods as tolerated. Avoid greasy, spicy, heavy foods. If nausea and/or vomiting occur, drink only clear  liquids until the nausea and/or vomiting subsides. Call your physician if vomiting continues.  Special Instructions/Symptoms: Your throat may feel dry or sore from the anesthesia or the breathing tube placed in your throat during surgery. If this causes discomfort, gargle with warm salt water. The discomfort should disappear within 24 hours.  If you had a scopolamine patch placed behind your ear for the management of post- operative nausea and/or vomiting:  1. The medication in the patch is effective for 72 hours, after which it should be removed.  Wrap patch in a tissue and discard in the trash. Wash hands thoroughly with soap and water. 2. You may remove the patch earlier than 72 hours if you experience unpleasant side effects which may include dry mouth, dizziness or visual disturbances. 3. Avoid touching the  patch. Wash your hands with soap and water after contact with the patch.      Information for Discharge Teaching: EXPAREL (bupivacaine liposome injectable suspension)   Pain relief is important to your recovery. The goal is to control your pain so you can move easier and return to your normal activities as soon as possible after your procedure. Your physician may use several types of medicines to manage pain, swelling, and more.  Your surgeon or anesthesiologist gave you EXPAREL(bupivacaine) to help control your pain after surgery.  EXPAREL is a local anesthetic designed to release slowly over an extended period of time to provide pain relief by numbing the tissue around the surgical site. EXPAREL is designed to release pain medication over time and can control pain for up to 72 hours. Depending on how you respond to EXPAREL, you may require less pain medication during your recovery. EXPAREL can help reduce or eliminate the need for opioids during the first few days after surgery when pain relief is needed the most. EXPAREL is not an opioid and is not addictive. It does not cause  sleepiness or sedation.   Important! A teal colored band has been placed on your arm with the date, time and amount of EXPAREL you have received. Please leave this armband in place for the full 96 hours following administration, and then you may remove the band. If you return to the hospital for any reason within 96 hours following the administration of EXPAREL, the armband provides important information that your health care providers to know, and alerts them that you have received this anesthetic.    Possible side effects of EXPAREL: Temporary loss of sensation or ability to move in the area where medication was injected. Nausea, vomiting, constipation Rarely, numbness and tingling in your mouth or lips, lightheadedness, or anxiety may occur. Call your doctor right away if you think you may be experiencing any of these sensations, or if you have other questions regarding possible side effects.  Follow all other discharge instructions given to you by your surgeon or nurse. Eat a healthy diet and drink plenty of water or other fluids.

## 2022-12-03 NOTE — Anesthesia Procedure Notes (Signed)
Procedure Name: Intubation Date/Time: 12/03/2022 7:34 AM  Performed by: Demetrio Lapping, CRNAPre-anesthesia Checklist: Patient identified, Emergency Drugs available, Suction available and Patient being monitored Patient Re-evaluated:Patient Re-evaluated prior to induction Oxygen Delivery Method: Circle System Utilized Preoxygenation: Pre-oxygenation with 100% oxygen Induction Type: IV induction Ventilation: Mask ventilation without difficulty Laryngoscope Size: Mac and 3 Grade View: Grade I Tube type: Oral Tube size: 7.0 mm Number of attempts: 1 Airway Equipment and Method: Stylet Placement Confirmation: ETT inserted through vocal cords under direct vision, positive ETCO2 and breath sounds checked- equal and bilateral Secured at: 21 cm Tube secured with: Tape Dental Injury: Teeth and Oropharynx as per pre-operative assessment

## 2022-12-03 NOTE — Op Note (Signed)
Breast Reduction Op note:    DATE OF PROCEDURE: 12/03/2022  LOCATION: [facility]  SURGEON: Foster Simpson, DO  ASSISTANT: Caroline More, PA  PREOPERATIVE DIAGNOSIS 1. Macromastia 2. Neck Pain 3. Back Pain  POSTOPERATIVE DIAGNOSIS 1. Macromastia 2. Neck Pain 3. Back Pain  PROCEDURES 1. Bilateral breast reduction.  Right reduction 1330 g, Left reduction 1303 g  COMPLICATIONS: None.  DRAINS: none  INDICATIONS FOR PROCEDURE Famie Hearst is a 67 y.o. year-old female born on [birth year],with a history of symptomatic macromastia with concominant back pain, neck pain, shoulder grooving from her bra.   MRN: [id number]  CONSENT Informed consent was obtained directly from the patient. The risks, benefits and alternatives were fully discussed. Specific risks including but not limited to bleeding, infection, hematoma, seroma, scarring, pain, nipple necrosis, asymmetry, poor cosmetic results, and need for further surgery were discussed. The patient's questions were answered.  DESCRIPTION OF PROCEDURE  Patient was brought into the operating room and rested on the operating room table in the supine position.  SCDs were placed and appropriate padding was performed.  Antibiotics were given. The patient underwent general anesthesia and the chest was prepped and draped in a sterile fashion.  A timeout was performed and all information was confirmed to be correct by those in the room.  Right side: Preoperative markings were confirmed.  Incision lines were injected with local containing epinephrine.  After waiting for vasoconstriction, the marked lines were incised with a #15 blade.  A Wise-pattern superomedial breast reduction was performed by de-epithelializing the pedicle, using bovie to create the superomedial pedicle, and removing breast tissue from the superior, lateral, and inferior portions of the breast.  Care was taken to not undermine the breast pedicle.  Experel and Cellerate were placed in the pocket. Hemostasis was achieved.  The nipple was gently rotated into position and the soft tissue closed with 4-0 Monocryl.   The pocket was irrigated and hemostasis confirmed.  The deep tissues were approximated with 3-0 PDS sutures.  The skin was closed with deep dermal 3-0 Monocryl.  The nipple and skin flaps had good capillary refill at the end of the procedure.    Left side: Preoperative markings were confirmed.  Incision lines were injected with local containing epinephrine.  After waiting for vasoconstriction, the marked lines were incised with a #15 blade.  A Wise-pattern superomedial breast reduction was performed by de-epithelializing the pedicle, using bovie to create the superomedial pedicle, and removing breast tissue from the superior, lateral, and inferior portions of the breast.  Care was taken to not undermine the breast pedicle.Experel and cellerate were placed in the pocket. Hemostasis was achieved.  The nipple was gently rotated into position and the soft tissue was closed with 4-0 Monocryl.  The patient was sat upright and size and shape symmetry was confirmed.  The pocket was irrigated and hemostasis confirmed.  The deep tissues were approximated with 3-0 PDS sutures. The skin was closed with deep dermal 3-0 Monocryl.  Dermabond was applied.  A breast binder and ABDs were placed.  The nipple and skin flaps had good capillary refill at the end of the procedure.  The patient tolerated the procedure well. The patient was allowed to wake from anesthesia and taken to the recovery room in satisfactory condition.  The advanced practice practitioner (APP) assisted throughout the case.  The APP was essential in retraction and counter traction when needed to make the case progress smoothly.  This retraction and assistance made it  possible to see the tissue plans for the procedure.  The assistance was needed for blood control, tissue re-approximation and  assisted with closure of the incision site.

## 2022-12-03 NOTE — Transfer of Care (Signed)
Immediate Anesthesia Transfer of Care Note  Patient: Diane Walsh  Procedure(s) Performed: MAMMARY REDUCTION  (BREAST) (Bilateral: Breast)  Patient Location: PACU  Anesthesia Type:General  Level of Consciousness: awake and patient cooperative  Airway & Oxygen Therapy: Patient Spontanous Breathing and Patient connected to face mask oxygen  Post-op Assessment: Report given to RN and Post -op Vital signs reviewed and stable  Post vital signs: Reviewed and stable  Last Vitals:  Vitals Value Taken Time  BP 142/74 12/03/22 1000  Temp 37.2 C 12/03/22 0942  Pulse 89 12/03/22 1002  Resp 19 12/03/22 1002  SpO2 100 % 12/03/22 1002  Vitals shown include unfiled device data.  Last Pain:  Vitals:   12/03/22 0942  TempSrc:   PainSc: 0-No pain      Patients Stated Pain Goal: 3 (12/03/22 5638)  Complications: No notable events documented.

## 2022-12-03 NOTE — Anesthesia Preprocedure Evaluation (Addendum)
Anesthesia Evaluation  Patient identified by MRN, date of birth, ID band Patient awake    Reviewed: Allergy & Precautions, NPO status , Patient's Chart, lab work & pertinent test results  History of Anesthesia Complications (+) PONV and history of anesthetic complications  Airway Mallampati: II  TM Distance: >3 FB Neck ROM: Full    Dental no notable dental hx.    Pulmonary asthma ,  COPD inhaler   Pulmonary exam normal        Cardiovascular negative cardio ROS Normal cardiovascular exam     Neuro/Psych  PSYCHIATRIC DISORDERS Anxiety Depression    negative neurological ROS     GI/Hepatic Neg liver ROS,GERD  Medicated and Controlled,,  Endo/Other  Hypothyroidism    Renal/GU negative Renal ROS     Musculoskeletal  (+) Arthritis ,    Abdominal  (+) + obese  Peds  Hematology negative hematology ROS (+)   Anesthesia Other Findings HYPERTROPHY OF BREAST  Reproductive/Obstetrics                             Anesthesia Physical Anesthesia Plan  ASA: 3  Anesthesia Plan: General   Post-op Pain Management:    Induction: Intravenous  PONV Risk Score and Plan: 4 or greater and Ondansetron, Midazolam, Dexamethasone, Propofol infusion and Treatment may vary due to age or medical condition  Airway Management Planned: Oral ETT  Additional Equipment:   Intra-op Plan:   Post-operative Plan: Extubation in OR  Informed Consent: I have reviewed the patients History and Physical, chart, labs and discussed the procedure including the risks, benefits and alternatives for the proposed anesthesia with the patient or authorized representative who has indicated his/her understanding and acceptance.     Dental advisory given  Plan Discussed with: CRNA  Anesthesia Plan Comments:         Anesthesia Quick Evaluation

## 2022-12-03 NOTE — Interval H&P Note (Signed)
I have reviewed the note and seen the patient. Agree with plan for bilateral breast reduction with liposuction.

## 2022-12-04 ENCOUNTER — Ambulatory Visit (INDEPENDENT_AMBULATORY_CARE_PROVIDER_SITE_OTHER): Payer: Medicare PPO | Admitting: Student

## 2022-12-04 ENCOUNTER — Encounter (HOSPITAL_BASED_OUTPATIENT_CLINIC_OR_DEPARTMENT_OTHER): Payer: Self-pay | Admitting: Plastic Surgery

## 2022-12-04 DIAGNOSIS — N62 Hypertrophy of breast: Secondary | ICD-10-CM

## 2022-12-04 NOTE — Progress Notes (Signed)
Patient is 67 year old female who underwent bilateral breast reduction with Dr. Ulice Bold yesterday.  Patient reports she is doing well.  She states that she has a little bit of soreness out laterally bilaterally.  She states otherwise pain has been fairly controlled.  Patient states that she did not sleep well last night, but she attributes it to sleeping during the day yesterday and to some pain.  Patient states that she is ambulatory and eating and drinking without issue.  She states she has not yet had a bowel movement.  She denies any nausea or vomiting.  Recommended to the patient to control her pain with alternating Tylenol and ibuprofen throughout the day.  Discussed with her she may take a tramadol if needed for pain as well.  Patient expressed understanding.  Discussed with the patient that she should try to get on a regular sleep schedule.  Patient expressed understanding.  Recommended the patient drink plenty of water and to make sure she is getting plenty of fiber and to help her have a bowel movement.  Discussed with her is that if she does not have a bowel movement in the next day or so, she should consider taking MiraLAX.  Patient expressed understanding.  Patient to follow back up at her for scheduled visit.  I instructed her to call in the meantime she has any questions or concerns about anything.

## 2022-12-07 LAB — SURGICAL PATHOLOGY

## 2022-12-15 ENCOUNTER — Ambulatory Visit (INDEPENDENT_AMBULATORY_CARE_PROVIDER_SITE_OTHER): Payer: Medicare PPO | Admitting: Plastic Surgery

## 2022-12-15 DIAGNOSIS — N62 Hypertrophy of breast: Secondary | ICD-10-CM

## 2022-12-15 NOTE — Progress Notes (Signed)
The patient is a 67 year old female here for follow-up after undergoing bilateral breast reduction on October 3.  She had 1300 g removed from each breast.  Overall she is doing wonderful.  No sign of a hematoma, seroma or infection.  Pain has been minimal.  Continue with the sports bra and no heavy lifting.  We would like to see her back in a couple weeks.

## 2022-12-23 NOTE — Telephone Encounter (Signed)
I called the patient in regards to her MyChart message.  She states that recently she took her sports bra off, it stuck to her skin and that she noticed a little bit of oozing afterwards.  She denies any other issues or concerns.  Discussed with patient that she can apply Vaseline and a nonstick gauze to the area.  I did offer her an appointment if she is very concerned about it, but patient states that she is going to try the Vaseline and nonstick gauze first.  I instructed the patient to call if she has any further questions or concerns.

## 2022-12-30 ENCOUNTER — Encounter: Payer: Self-pay | Admitting: Physician Assistant

## 2022-12-30 ENCOUNTER — Ambulatory Visit (INDEPENDENT_AMBULATORY_CARE_PROVIDER_SITE_OTHER): Payer: Medicare PPO | Admitting: Physician Assistant

## 2022-12-30 VITALS — BP 150/85 | HR 90

## 2022-12-30 DIAGNOSIS — Z9889 Other specified postprocedural states: Secondary | ICD-10-CM | POA: Diagnosis not present

## 2022-12-30 NOTE — Progress Notes (Signed)
Patient is a pleasant 67 year old female s/p bilateral breast reduction performed 12/03/2022 by Dr. Ulice Bold who presents to clinic for postoperative follow-up.  1300 g were removed from each breast at time of surgery.  She was last seen here in clinic on 12/15/2022.  At that time, physical exam was reassuring and without evidence concerning for hematoma or seroma.  Recommended continued activity modifications and compressive garments.  Today, patient is doing well.  She is eager to pick up her 79-month-old granddaughter.  She did report that she had some sticking/drainage from her right NAC.  She was advised to apply Vaseline liberally to that area.  Patient reports that she recently developed URI symptoms, attributes it to changing weather.  On exam, breasts have excellent shape and symmetry.  No asymmetric swelling.  Each NAC is viable, left still has residual Dermabond.  Right sided NAC has good color, but full-thickness wound noted to superior T zone at junction with areola.  Approximately 1 x 1 cm.  No other large wounds noted.  Steri-Strips removed from each side, well-tolerated and without complication.  No inferior T zone or inframammary wounds.  No palpable seromas or hematomas throughout.  Patient with a superior T zone wound at junction of right NAC, but no evidence concerning for infection.  Recommend Vaseline gauze.  Continued compression and activity modifications.  Follow-up in 2 weeks, sooner if needed.  Picture(s) obtained of the patient and placed in the chart were with the patient's or guardian's permission.

## 2023-01-12 ENCOUNTER — Ambulatory Visit (INDEPENDENT_AMBULATORY_CARE_PROVIDER_SITE_OTHER): Payer: Medicare PPO | Admitting: Physician Assistant

## 2023-01-12 ENCOUNTER — Encounter: Payer: Self-pay | Admitting: Physician Assistant

## 2023-01-12 VITALS — BP 142/85 | HR 74 | Ht 65.0 in | Wt 220.0 lb

## 2023-01-12 DIAGNOSIS — Z9889 Other specified postprocedural states: Secondary | ICD-10-CM

## 2023-01-12 NOTE — Progress Notes (Signed)
Patient is a pleasant 68 year old female s/p bilateral breast reduction performed 12/03/2022 by Dr. Ulice Bold who presents to clinic for postoperative follow-up.  1300 g were removed from each breast at time of surgery.   She was last seen here in clinic on 12/30/2022.  At that time, exam was entirely reassuring aside from a small, 1 x 1 cm full-thickness wound at superior T zone right breast.  No evidence concerning for infection.  Recommended continued activity modifications and compressive garments.  Follow-up in 2 weeks.  Today, patient is doing well.  She has been applying Vaseline to her incisions throughout, particularly along her areolar wounds right breast.  She states that she feels as though things have been getting better.  She has however been battling URI symptoms for the past 4 weeks requiring antibiotics and systemic steroids.  Fortunately she is no longer requiring the steroids, but discussed how they can delay wound healing.  She is quite pleased with the outcome of her breast reduction surgery and endorses significant relief of her chronic shoulder discomfort.  On exam, breasts have excellent shape and symmetry.  NAC's are healthy and viable.  Small, less than 0.5 cm wounds along right NAC that are superficial, healing well.  Left nipple does appear to be pulling inferior laterally, but otherwise well-healed.  Soft throughout.  No overlying skin changes.    Recommend continued Vaseline to her incisions throughout until her wounds have healed and then she can transition to silicone scar gels twice daily x 3 months.  At this time, can increase activity as tolerated.  Compressive garments the longer required.  No specific follow-up needed, but she can call the office should she have any questions or concerns.  Picture(s) obtained of the patient and placed in the chart were with the patient's or guardian's permission.

## 2023-01-18 ENCOUNTER — Encounter: Payer: Medicare PPO | Admitting: Student

## 2023-03-19 ENCOUNTER — Other Ambulatory Visit: Payer: Self-pay | Admitting: Surgery

## 2023-03-19 DIAGNOSIS — Z1231 Encounter for screening mammogram for malignant neoplasm of breast: Secondary | ICD-10-CM

## 2023-05-04 ENCOUNTER — Ambulatory Visit
Admission: RE | Admit: 2023-05-04 | Discharge: 2023-05-04 | Disposition: A | Discharge status: Home / Self Care | Payer: Medicare PPO | Source: Ambulatory Visit | Attending: Surgery | Admitting: Surgery

## 2023-05-04 DIAGNOSIS — Z1231 Encounter for screening mammogram for malignant neoplasm of breast: Secondary | ICD-10-CM | POA: Diagnosis present

## 2024-04-05 ENCOUNTER — Other Ambulatory Visit: Payer: Self-pay | Admitting: Surgery

## 2024-04-05 DIAGNOSIS — Z1231 Encounter for screening mammogram for malignant neoplasm of breast: Secondary | ICD-10-CM

## 2024-05-04 ENCOUNTER — Encounter
